# Patient Record
Sex: Female | Born: 1995 | Race: White | Hispanic: No | Marital: Single | Smoking: Never smoker
Health system: Southern US, Community
[De-identification: ages and names within clinical notes are randomized; demographics above are authoritative.]

## PROBLEM LIST (undated history)

## (undated) DIAGNOSIS — I10 Essential (primary) hypertension: Secondary | ICD-10-CM

---
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
## 2018-06-27 ENCOUNTER — Other Ambulatory Visit: Payer: Self-pay

## 2018-06-27 ENCOUNTER — Emergency Department (HOSPITAL_COMMUNITY)
Admission: EM | Admit: 2018-06-27 | Discharge: 2018-06-27 | Disposition: A | Discharge status: Home / Self Care | Payer: No Typology Code available for payment source | Attending: Emergency Medicine | Admitting: Emergency Medicine

## 2018-06-27 ENCOUNTER — Encounter (HOSPITAL_COMMUNITY): Payer: Self-pay | Admitting: Emergency Medicine

## 2018-06-27 DIAGNOSIS — I10 Essential (primary) hypertension: Secondary | ICD-10-CM | POA: Insufficient documentation

## 2018-06-27 DIAGNOSIS — Y93G1 Activity, food preparation and clean up: Secondary | ICD-10-CM | POA: Diagnosis not present

## 2018-06-27 DIAGNOSIS — Y990 Civilian activity done for income or pay: Secondary | ICD-10-CM | POA: Diagnosis not present

## 2018-06-27 DIAGNOSIS — Y929 Unspecified place or not applicable: Secondary | ICD-10-CM | POA: Diagnosis not present

## 2018-06-27 DIAGNOSIS — S61215A Laceration without foreign body of left ring finger without damage to nail, initial encounter: Secondary | ICD-10-CM | POA: Diagnosis present

## 2018-06-27 DIAGNOSIS — R000 Tachycardia, unspecified: Secondary | ICD-10-CM | POA: Insufficient documentation

## 2018-06-27 DIAGNOSIS — W458XXA Other foreign body or object entering through skin, initial encounter: Secondary | ICD-10-CM | POA: Insufficient documentation

## 2018-06-27 DIAGNOSIS — Z23 Encounter for immunization: Secondary | ICD-10-CM | POA: Diagnosis not present

## 2018-06-27 MED ORDER — TETANUS-DIPHTH-ACELL PERTUSSIS 5-2.5-18.5 LF-MCG/0.5 IM SUSP
0.5000 mL | Freq: Once | INTRAMUSCULAR | Status: AC
  Administered 2018-06-27: 0.5 mL via INTRAMUSCULAR
  Filled 2018-06-27: qty 0.5

## 2018-06-27 NOTE — Discharge Instructions (Addendum)
Return to ED if you start to develop chest pain, shortness of breath, coughing up blood or signs of infection of your finger including redness surrounding the site or pus draining from it.

## 2018-06-27 NOTE — ED Triage Notes (Signed)
Pt works at [employer] and cut her L ring finger on metal door. Laceration superficial and hemorrhage controled upon presentation here. Needs f/u for worker's comp.
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
## 2019-12-28 ENCOUNTER — Ambulatory Visit: Payer: Self-pay | Attending: Internal Medicine

## 2019-12-28 DIAGNOSIS — Z23 Encounter for immunization: Secondary | ICD-10-CM

## 2019-12-28 NOTE — Progress Notes (Signed)
   Covid-19 Vaccination Clinic  Name:  Sarah Banks    MRN: [id number] DOB: 01-Oct-1996  12/28/2019  Ms. [name] was observed post Covid-19 immunization for 15 minutes without incident. She was provided with Vaccine Information Sheet and instruction to access the V-Safe system.   Ms. [name] was instructed to call 911 with any severe reactions post vaccine: Marland Kitchen Difficulty breathing  . Swelling of face and throat  . A fast heartbeat  . A bad rash all over body  . Dizziness and weakness   Immunizations Administered    Name Date Dose VIS Date Route   Pfizer COVID-19 Vaccine 12/28/2019  1:28 PM 0.3 mL 09/29/2019 Intramuscular   Manufacturer: [employer], [employer]   Lot: [id number]   NDC: [address]-7

## 2020-01-22 ENCOUNTER — Ambulatory Visit: Payer: Medicaid Other | Attending: Internal Medicine

## 2020-01-22 DIAGNOSIS — Z23 Encounter for immunization: Secondary | ICD-10-CM

## 2020-01-22 NOTE — Progress Notes (Signed)
   Covid-19 Vaccination Clinic  Name:  Sarah Banks    MRN: [id number] DOB: 1996/09/02  01/22/2020  Ms. [name] was observed post Covid-19 immunization for 15 minutes without incident. She was provided with Vaccine Information Sheet and instruction to access the V-Safe system.   Ms. [name] was instructed to call 911 with any severe reactions post vaccine: Marland Kitchen Difficulty breathing  . Swelling of face and throat  . A fast heartbeat  . A bad rash all over body  . Dizziness and weakness   Immunizations Administered    Name Date Dose VIS Date Route   Pfizer COVID-19 Vaccine 01/22/2020 10:28 AM 0.3 mL 09/29/2019 Intramuscular   Manufacturer: [employer], [employer]   Lot: [id number]   NDC: [address]-0

## 2020-10-19 NOTE — L&D Delivery Note (Addendum)
OB/GYN Faculty Practice Delivery Note  Sarah Banks is a 25 y.o. G1P1 s/p vaginal delivery at [redacted]w[redacted]d. She was admitted for IUFD in the setting of unknown and unwanted pregnancy, now with HELLP, with IUD placement in 08/2020 at [facility].  ROM: 0h 2m with clear fluid GBS Status: n/a Maximum Maternal Temperature: 99.87F  Labor Progress: Pt initially received vaginal cytotec (600mg  for first dose, 400mg  for second dose). Given 5 whole cytotec tablets obtained from vaginal vault at 2305, a buccal dose of cytotec was administered. Pt then had SROM for clear fluid at 0256, at which time she was found to have complete cervical dilation. She then had a precipitous, uncomplicated delivery as noted below.  Delivery Date/Time: 02/17/21 at 0320 Delivery: Fetus delivered spontaneously from footling breech presentation without any maternal effort. Per pt request, fetus was placed in basin. Cord clamped x 2, and cut by author. Placenta delivered spontaneously with gentle cord traction. Fundus firm with massage and Pitocin. Labia, perineum, vagina, and cervix were inspected, without evidence of lacerations. Upon delivery of placenta, Paraguard IUD was visualized, adhered to amniotic membranes.   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 Also started procardia 30 mg daily. Reassuringly, blood pressure now in mild range and no concerning symptoms. Will continue to trend preeclampsia labs.  , MD OB/GYN Fellow, Faculty Practice   Attestation of Attending Supervision of Fellow: Evaluation and management procedures were performed by the fellow under my supervision.  I have seen and reviewed the fellow's note and chart, and I agree with the management and plan.   04/19/21  MD Attending Center for Sarah Banks)

## 2021-02-16 ENCOUNTER — Other Ambulatory Visit: Payer: Self-pay

## 2021-02-16 ENCOUNTER — Inpatient Hospital Stay (HOSPITAL_COMMUNITY)
Admission: EM | Admit: 2021-02-16 | Discharge: 2021-02-21 | DRG: 779 | Disposition: A | Discharge status: Home / Self Care | Payer: BC Managed Care – PPO | Attending: Obstetrics and Gynecology | Admitting: Obstetrics and Gynecology

## 2021-02-16 ENCOUNTER — Encounter (HOSPITAL_COMMUNITY): Payer: Self-pay

## 2021-02-16 ENCOUNTER — Inpatient Hospital Stay (HOSPITAL_BASED_OUTPATIENT_CLINIC_OR_DEPARTMENT_OTHER): Payer: BC Managed Care – PPO

## 2021-02-16 DIAGNOSIS — O328XX0 Maternal care for other malpresentation of fetus, not applicable or unspecified: Secondary | ICD-10-CM | POA: Diagnosis present

## 2021-02-16 DIAGNOSIS — O1422 HELLP syndrome (HELLP), second trimester: Secondary | ICD-10-CM | POA: Diagnosis present

## 2021-02-16 DIAGNOSIS — O99891 Other specified diseases and conditions complicating pregnancy: Secondary | ICD-10-CM

## 2021-02-16 DIAGNOSIS — R109 Unspecified abdominal pain: Secondary | ICD-10-CM | POA: Diagnosis present

## 2021-02-16 DIAGNOSIS — Z3A19 19 weeks gestation of pregnancy: Secondary | ICD-10-CM

## 2021-02-16 DIAGNOSIS — Z30432 Encounter for removal of intrauterine contraceptive device: Secondary | ICD-10-CM

## 2021-02-16 DIAGNOSIS — O364XX0 Maternal care for intrauterine death, not applicable or unspecified: Secondary | ICD-10-CM | POA: Diagnosis not present

## 2021-02-16 DIAGNOSIS — O26899 Other specified pregnancy related conditions, unspecified trimester: Secondary | ICD-10-CM

## 2021-02-16 DIAGNOSIS — O021 Missed abortion: Secondary | ICD-10-CM | POA: Diagnosis present

## 2021-02-16 DIAGNOSIS — O3680X0 Pregnancy with inconclusive fetal viability, not applicable or unspecified: Secondary | ICD-10-CM

## 2021-02-16 DIAGNOSIS — O4692 Antepartum hemorrhage, unspecified, second trimester: Secondary | ICD-10-CM

## 2021-02-16 DIAGNOSIS — Z20822 Contact with and (suspected) exposure to covid-19: Secondary | ICD-10-CM | POA: Diagnosis present

## 2021-02-16 DIAGNOSIS — O1425 HELLP syndrome, complicating the puerperium: Secondary | ICD-10-CM | POA: Diagnosis not present

## 2021-02-16 DIAGNOSIS — O1424 HELLP syndrome, complicating childbirth: Secondary | ICD-10-CM | POA: Diagnosis not present

## 2021-02-16 DIAGNOSIS — Z3490 Encounter for supervision of normal pregnancy, unspecified, unspecified trimester: Secondary | ICD-10-CM

## 2021-02-16 DIAGNOSIS — O162 Unspecified maternal hypertension, second trimester: Secondary | ICD-10-CM

## 2021-02-16 DIAGNOSIS — O368320 Maternal care for abnormalities of the fetal heart rate or rhythm, second trimester, not applicable or unspecified: Secondary | ICD-10-CM

## 2021-02-16 DIAGNOSIS — O1420 HELLP syndrome (HELLP), unspecified trimester: Secondary | ICD-10-CM

## 2021-02-16 LAB — CBC
HCT: 31.4 % — ABNORMAL LOW (ref 36.0–46.0)
HCT: 34.5 % — ABNORMAL LOW (ref 36.0–46.0)
Hemoglobin: 10.9 g/dL — ABNORMAL LOW (ref 12.0–15.0)
Hemoglobin: 12.1 g/dL (ref 12.0–15.0)
MCH: 29.5 pg (ref 26.0–34.0)
MCH: 29.7 pg (ref 26.0–34.0)
MCHC: 34.7 g/dL (ref 30.0–36.0)
MCHC: 35.1 g/dL (ref 30.0–36.0)
MCV: 84.9 fL (ref 80.0–100.0)
MCV: 84.6 fL (ref 80.0–100.0)
Platelets: 100 10*3/uL — ABNORMAL LOW (ref 150–400)
Platelets: 120 10*3/uL — ABNORMAL LOW (ref 150–400)
RBC: 3.7 MIL/uL — ABNORMAL LOW (ref 3.87–5.11)
RBC: 4.08 MIL/uL (ref 3.87–5.11)
RDW: 15.2 % (ref 11.5–15.5)
RDW: 15.2 % (ref 11.5–15.5)
WBC: 14.7 10*3/uL — ABNORMAL HIGH (ref 4.0–10.5)
WBC: 15.8 10*3/uL — ABNORMAL HIGH (ref 4.0–10.5)
nRBC: 0 % (ref 0.0–0.2)
nRBC: 0 % (ref 0.0–0.2)

## 2021-02-16 LAB — TYPE AND SCREEN
ABO/RH(D): O POS
Antibody Screen: NEGATIVE

## 2021-02-16 LAB — COMPREHENSIVE METABOLIC PANEL
ALT: 79 U/L — ABNORMAL HIGH (ref 0–44)
ALT: 68 U/L — ABNORMAL HIGH (ref 0–44)
ALT: 74 U/L — ABNORMAL HIGH (ref 0–44)
AST: 73 U/L — ABNORMAL HIGH (ref 15–41)
AST: 60 U/L — ABNORMAL HIGH (ref 15–41)
AST: 57 U/L — ABNORMAL HIGH (ref 15–41)
Albumin: 2.2 g/dL — ABNORMAL LOW (ref 3.5–5.0)
Albumin: 1.9 g/dL — ABNORMAL LOW (ref 3.5–5.0)
Albumin: 2 g/dL — ABNORMAL LOW (ref 3.5–5.0)
Alkaline Phosphatase: 127 U/L — ABNORMAL HIGH (ref 38–126)
Alkaline Phosphatase: 106 U/L (ref 38–126)
Alkaline Phosphatase: 123 U/L (ref 38–126)
Anion gap: 9 (ref 5–15)
Anion gap: 6 (ref 5–15)
Anion gap: 8 (ref 5–15)
BUN: 6 mg/dL (ref 6–20)
BUN: 8 mg/dL (ref 6–20)
BUN: 8 mg/dL (ref 6–20)
CO2: 25 mmol/L (ref 22–32)
CO2: 24 mmol/L (ref 22–32)
CO2: 23 mmol/L (ref 22–32)
Calcium: 8.2 mg/dL — ABNORMAL LOW (ref 8.9–10.3)
Calcium: 7.8 mg/dL — ABNORMAL LOW (ref 8.9–10.3)
Calcium: 7.9 mg/dL — ABNORMAL LOW (ref 8.9–10.3)
Chloride: 102 mmol/L (ref 98–111)
Chloride: 104 mmol/L (ref 98–111)
Chloride: 101 mmol/L (ref 98–111)
Creatinine, Ser: 0.88 mg/dL (ref 0.44–1.00)
Creatinine, Ser: 0.96 mg/dL (ref 0.44–1.00)
Creatinine, Ser: 0.87 mg/dL (ref 0.44–1.00)
GFR, Estimated: >60 mL/min (ref >60)
GFR, Estimated: >60 mL/min (ref >60)
GFR, Estimated: >60 mL/min (ref >60)
Glucose, Bld: 84 mg/dL (ref 70–99)
Glucose, Bld: 102 mg/dL — ABNORMAL HIGH (ref 70–99)
Glucose, Bld: 99 mg/dL (ref 70–99)
Potassium: 3.3 mmol/L — ABNORMAL LOW (ref 3.5–5.1)
Potassium: 3.5 mmol/L (ref 3.5–5.1)
Potassium: 3.9 mmol/L (ref 3.5–5.1)
Sodium: 136 mmol/L (ref 135–145)
Sodium: 134 mmol/L — ABNORMAL LOW (ref 135–145)
Sodium: 132 mmol/L — ABNORMAL LOW (ref 135–145)
Total Bilirubin: 0.6 mg/dL (ref 0.3–1.2)
Total Bilirubin: <0.1 mg/dL — ABNORMAL LOW (ref 0.3–1.2)
Total Bilirubin: 0.4 mg/dL (ref 0.3–1.2)
Total Protein: 5.2 g/dL — ABNORMAL LOW (ref 6.5–8.1)
Total Protein: 4.7 g/dL — ABNORMAL LOW (ref 6.5–8.1)
Total Protein: 5.1 g/dL — ABNORMAL LOW (ref 6.5–8.1)

## 2021-02-16 LAB — URINALYSIS, ROUTINE W REFLEX MICROSCOPIC
Bilirubin Urine: NEGATIVE
Glucose, UA: NEGATIVE mg/dL
Ketones, ur: NEGATIVE mg/dL
Nitrite: NEGATIVE
Protein, ur: >=300 mg/dL — AB
Specific Gravity, Urine: 1.011 (ref 1.005–1.030)
pH: 7 (ref 5.0–8.0)

## 2021-02-16 LAB — CBC WITH DIFFERENTIAL/PLATELET
Abs Immature Granulocytes: 0.06 10*3/uL (ref 0.00–0.07)
Basophils Absolute: 0.1 10*3/uL (ref 0.0–0.1)
Basophils Relative: 0 %
Eosinophils Absolute: 0.1 10*3/uL (ref 0.0–0.5)
Eosinophils Relative: 1 %
HCT: 37.7 % (ref 36.0–46.0)
Hemoglobin: 12.9 g/dL (ref 12.0–15.0)
Immature Granulocytes: 0 %
Lymphocytes Relative: 15 %
Lymphs Abs: 2 10*3/uL (ref 0.7–4.0)
MCH: 29.5 pg (ref 26.0–34.0)
MCHC: 34.2 g/dL (ref 30.0–36.0)
MCV: 86.1 fL (ref 80.0–100.0)
Monocytes Absolute: 0.8 10*3/uL (ref 0.1–1.0)
Monocytes Relative: 6 %
Neutro Abs: 10.4 10*3/uL — ABNORMAL HIGH (ref 1.7–7.7)
Neutrophils Relative %: 78 %
Platelets: 124 10*3/uL — ABNORMAL LOW (ref 150–400)
RBC: 4.38 MIL/uL (ref 3.87–5.11)
RDW: 15.3 % (ref 11.5–15.5)
WBC: 13.4 10*3/uL — ABNORMAL HIGH (ref 4.0–10.5)
nRBC: 0 % (ref 0.0–0.2)

## 2021-02-16 LAB — PROTEIN / CREATININE RATIO, URINE
Creatinine, Urine: 86.79 mg/dL
Protein Creatinine Ratio: 11.89 mg/mg{Cre} — ABNORMAL HIGH (ref 0.00–0.15)
Total Protein, Urine: 1032 mg/dL

## 2021-02-16 LAB — WET PREP, GENITAL
Clue Cells Wet Prep HPF POC: NONE SEEN
Sperm: NONE SEEN
Trich, Wet Prep: NONE SEEN
Yeast Wet Prep HPF POC: NONE SEEN

## 2021-02-16 LAB — RESP PANEL BY RT-PCR (FLU A&B, COVID) ARPGX2
Influenza A by PCR: NEGATIVE
Influenza B by PCR: NEGATIVE
SARS Coronavirus 2 by RT PCR: NEGATIVE

## 2021-02-16 LAB — DIC (DISSEMINATED INTRAVASCULAR COAGULATION)PANEL
D-Dimer, Quant: 4.25 ug/mL-FEU — ABNORMAL HIGH (ref 0.00–0.50)
Fibrinogen: >800 mg/dL — ABNORMAL HIGH (ref 210–475)
INR: 0.9 (ref 0.8–1.2)
Platelets: 106 10*3/uL — ABNORMAL LOW (ref 150–400)
Prothrombin Time: 12.6 seconds (ref 11.4–15.2)
Smear Review: NONE SEEN
aPTT: 29 seconds (ref 24–36)

## 2021-02-16 LAB — HCG, QUANTITATIVE, PREGNANCY: hCG, Beta Chain, Quant, S: 68450 m[IU]/mL — ABNORMAL HIGH (ref <5)

## 2021-02-16 LAB — HIV ANTIBODY (ROUTINE TESTING W REFLEX): HIV Screen 4th Generation wRfx: NONREACTIVE

## 2021-02-16 LAB — POC URINE PREG, ED: Preg Test, Ur: POSITIVE — AB

## 2021-02-16 LAB — ABO/RH: ABO/RH(D): O POS

## 2021-02-16 MED ORDER — HYDRALAZINE HCL 20 MG/ML IJ SOLN
10.0000 mg | INTRAMUSCULAR | Status: DC | PRN
Start: 2021-02-16 — End: 2021-02-19

## 2021-02-16 MED ORDER — LABETALOL HCL 5 MG/ML IV SOLN
80.0000 mg | INTRAVENOUS | Status: DC | PRN
  Administered 2021-02-16: 80 mg via INTRAVENOUS
  Filled 2021-02-16: qty 16

## 2021-02-16 MED ORDER — SODIUM CHLORIDE 0.9 % IV BOLUS
500.0000 mL | Freq: Once | INTRAVENOUS | Status: AC
  Administered 2021-02-16: 500 mL via INTRAVENOUS

## 2021-02-16 MED ORDER — MISOPROSTOL 200 MCG PO TABS
600.0000 ug | ORAL_TABLET | Freq: Once | ORAL | Status: AC
  Administered 2021-02-16: 600 ug via VAGINAL
  Filled 2021-02-16: qty 3

## 2021-02-16 MED ORDER — MAGNESIUM SULFATE 40 GM/1000ML IV SOLN
2.0000 g/h | INTRAVENOUS | Status: DC
  Filled 2021-02-16: qty 1000

## 2021-02-16 MED ORDER — MISOPROSTOL 200 MCG PO TABS
ORAL_TABLET | ORAL | Status: AC
  Filled 2021-02-16: qty 1

## 2021-02-16 MED ORDER — MISOPROSTOL 200 MCG PO TABS
400.0000 ug | ORAL_TABLET | ORAL | Status: DC
  Administered 2021-02-16: 400 ug via VAGINAL
  Filled 2021-02-16 (×3): qty 2

## 2021-02-16 MED ORDER — LACTATED RINGERS IV SOLN: INTRAVENOUS | Status: DC

## 2021-02-16 MED ORDER — LABETALOL HCL 100 MG PO TABS
200.0000 mg | ORAL_TABLET | Freq: Once | ORAL | Status: AC
  Administered 2021-02-16: 200 mg via ORAL
  Filled 2021-02-16: qty 2

## 2021-02-16 MED ORDER — NIFEDIPINE ER OSMOTIC RELEASE 30 MG PO TB24
30.0000 mg | ORAL_TABLET | Freq: Every day | ORAL | Status: DC
  Administered 2021-02-16 – 2021-02-17 (×2): 30 mg via ORAL
  Filled 2021-02-16 (×2): qty 1

## 2021-02-16 MED ORDER — MAGNESIUM SULFATE BOLUS VIA INFUSION
4.0000 g | Freq: Once | INTRAVENOUS | Status: AC
  Administered 2021-02-16: 4 g via INTRAVENOUS
  Filled 2021-02-16: qty 1000

## 2021-02-16 MED ORDER — LABETALOL HCL 5 MG/ML IV SOLN
20.0000 mg | INTRAVENOUS | Status: DC | PRN
  Administered 2021-02-16 – 2021-02-19 (×5): 20 mg via INTRAVENOUS
  Filled 2021-02-16 (×5): qty 4

## 2021-02-16 MED ORDER — MISOPROSTOL 200 MCG PO TABS: 400.0000 ug | ORAL_TABLET | Freq: Once | ORAL | Status: DC

## 2021-02-16 MED ORDER — MISOPROSTOL 200 MCG PO TABS
400.0000 ug | ORAL_TABLET | Freq: Once | ORAL | Status: AC
  Administered 2021-02-16: 400 ug via BUCCAL

## 2021-02-16 MED ORDER — LABETALOL HCL 5 MG/ML IV SOLN
40.0000 mg | INTRAVENOUS | Status: DC | PRN
  Administered 2021-02-16: 40 mg via INTRAVENOUS
  Filled 2021-02-16 (×2): qty 8

## 2021-02-16 MED ORDER — MISOPROSTOL 200 MCG PO TABS: 400.0000 ug | ORAL_TABLET | ORAL | Status: DC

## 2021-02-16 MED ORDER — SODIUM CHLORIDE 0.9 % IV BOLUS: 1000.0000 mL | Freq: Once | INTRAVENOUS | Status: DC

## 2021-02-16 NOTE — Progress Notes (Signed)
Sarah Banks is a 25 y.o. G1P0 at [redacted]w[redacted]d.  Subjective: No complaints.   Objective: BP 135/85   Pulse 84   Temp 98.6 F (37 C) (Oral)   Resp 16   Ht 5\' 4"  (1.626 m)   Wt 97.5 kg   LMP 09/07/2020 (Within Days)   SpO2 99%   BMI 36.90 kg/m    FHT:  NA UC: None    Labs: Results for orders placed or performed during the hospital encounter of 02/16/21 (from the past 24 hour(s))  POC urine preg, ED     Status: Abnormal   Collection Time: 02/16/21  8:36 AM  Result Value Ref Range   Preg Test, Ur POSITIVE (A) NEGATIVE  Urinalysis, Routine w reflex microscopic Urine, Clean Catch     Status: Abnormal   Collection Time: 02/16/21  8:44 AM  Result Value Ref Range   Color, Urine YELLOW YELLOW   APPearance HAZY (A) CLEAR   Specific Gravity, Urine 1.011 1.005 - 1.030   pH 7.0 5.0 - 8.0   Glucose, UA NEGATIVE NEGATIVE mg/dL   Hgb urine dipstick MODERATE (A) NEGATIVE   Bilirubin Urine NEGATIVE NEGATIVE   Ketones, ur NEGATIVE NEGATIVE mg/dL   Protein, ur 04/18/21 (A) NEGATIVE mg/dL   Nitrite NEGATIVE NEGATIVE   Leukocytes,Ua TRACE (A) NEGATIVE   RBC / HPF 0-5 0 - 5 RBC/hpf   WBC, UA 21-50 0 - 5 WBC/hpf   Bacteria, UA RARE (A) NONE SEEN   Squamous Epithelial / LPF 6-10 0 - 5  hCG, quantitative, pregnancy     Status: Abnormal   Collection Time: 02/16/21  8:54 AM  Result Value Ref Range   hCG, Beta Chain, Quant, S 68,450 (H) <5 mIU/mL  Comprehensive metabolic panel     Status: Abnormal   Collection Time: 02/16/21  8:57 AM  Result Value Ref Range   Sodium 136 135 - 145 mmol/L   Potassium 3.3 (L) 3.5 - 5.1 mmol/L   Chloride 102 98 - 111 mmol/L   CO2 25 22 - 32 mmol/L   Glucose, Bld 84 70 - 99 mg/dL   BUN 6 6 - 20 mg/dL   Creatinine, Ser 04/18/21 0.44 - 1.00 mg/dL   Calcium 8.2 (L) 8.9 - 10.3 mg/dL   Total Protein 5.2 (L) 6.5 - 8.1 g/dL   Albumin 2.2 (L) 3.5 - 5.0 g/dL   AST 73 (H) 15 - 41 U/L   ALT 79 (H) 0 - 44 U/L   Alkaline Phosphatase 127 (H) 38 - 126 U/L   Total Bilirubin 0.6 0.3  - 1.2 mg/dL   GFR, Estimated 7.61 >95 mL/min   Anion gap 9 5 - 15  CBC with Differential     Status: Abnormal   Collection Time: 02/16/21  8:57 AM  Result Value Ref Range   WBC 13.4 (H) 4.0 - 10.5 K/uL   RBC 4.38 3.87 - 5.11 MIL/uL   Hemoglobin 12.9 12.0 - 15.0 g/dL   HCT 04/18/21 32.6 - 71.2 %   MCV 86.1 80.0 - 100.0 fL   MCH 29.5 26.0 - 34.0 pg   MCHC 34.2 30.0 - 36.0 g/dL   RDW 45.8 09.9 - 83.3 %   Platelets 124 (L) 150 - 400 K/uL   nRBC 0.0 0.0 - 0.2 %   Neutrophils Relative % 78 %   Neutro Abs 10.4 (H) 1.7 - 7.7 K/uL   Lymphocytes Relative 15 %   Lymphs Abs 2.0 0.7 - 4.0 K/uL   Monocytes Relative  6 %   Monocytes Absolute 0.8 0.1 - 1.0 K/uL   Eosinophils Relative 1 %   Eosinophils Absolute 0.1 0.0 - 0.5 K/uL   Basophils Relative 0 %   Basophils Absolute 0.1 0.0 - 0.1 K/uL   Immature Granulocytes 0 %   Abs Immature Granulocytes 0.06 0.00 - 0.07 K/uL  Protein / creatinine ratio, urine     Status: Abnormal   Collection Time: 02/16/21  9:07 AM  Result Value Ref Range   Creatinine, Urine 86.79 mg/dL   Total Protein, Urine 1,032 mg/dL   Protein Creatinine Ratio 11.89 (H) 0.00 - 0.15 mg/mg[Cre]  ABO/Rh     Status: None   Collection Time: 02/16/21  9:36 AM  Result Value Ref Range   ABO/RH(D)      O POS Performed at [facility], [address], [place], [place] [address]   DIC Panel ONCE - STAT     Status: Abnormal   Collection Time: 02/16/21 11:29 AM  Result Value Ref Range   Prothrombin Time 12.6 11.4 - 15.2 seconds   INR 0.9 0.8 - 1.2   aPTT 29 24 - 36 seconds   Fibrinogen >800 (H) 210 - 475 mg/dL   D-Dimer, Quant 0.17 (H) 0.00 - 0.50 ug/mL-FEU   Platelets 106 (L) 150 - 400 K/uL   Smear Review NO SCHISTOCYTES SEEN   Resp Panel by RT-PCR (Flu A&B, Covid) Nasopharyngeal Swab     Status: None   Collection Time: 02/16/21 11:36 AM   Specimen: Nasopharyngeal Swab; Nasopharyngeal(NP) swabs in vial transport medium  Result Value Ref Range   SARS Coronavirus 2 by RT PCR  NEGATIVE NEGATIVE   Influenza A by PCR NEGATIVE NEGATIVE   Influenza B by PCR NEGATIVE NEGATIVE  CBC     Status: Abnormal   Collection Time: 02/16/21  1:20 PM  Result Value Ref Range   WBC 14.7 (H) 4.0 - 10.5 K/uL   RBC 3.70 (L) 3.87 - 5.11 MIL/uL   Hemoglobin 10.9 (L) 12.0 - 15.0 g/dL   HCT 49.4 (L) 49.6 - 75.9 %   MCV 84.9 80.0 - 100.0 fL   MCH 29.5 26.0 - 34.0 pg   MCHC 34.7 30.0 - 36.0 g/dL   RDW 16.3 84.6 - 65.9 %   Platelets 100 (L) 150 - 400 K/uL   nRBC 0.0 0.0 - 0.2 %  Comprehensive metabolic panel     Status: Abnormal   Collection Time: 02/16/21  1:20 PM  Result Value Ref Range   Sodium 134 (L) 135 - 145 mmol/L   Potassium 3.5 3.5 - 5.1 mmol/L   Chloride 104 98 - 111 mmol/L   CO2 24 22 - 32 mmol/L   Glucose, Bld 102 (H) 70 - 99 mg/dL   BUN 8 6 - 20 mg/dL   Creatinine, Ser 9.35 0.44 - 1.00 mg/dL   Calcium 7.8 (L) 8.9 - 10.3 mg/dL   Total Protein 4.7 (L) 6.5 - 8.1 g/dL   Albumin 1.9 (L) 3.5 - 5.0 g/dL   AST 60 (H) 15 - 41 U/L   ALT 68 (H) 0 - 44 U/L   Alkaline Phosphatase 106 38 - 126 U/L   Total Bilirubin <0.1 (L) 0.3 - 1.2 mg/dL   GFR, Estimated >70 >17 mL/min   Anion gap 6 5 - 15  Type and screen     Status: None (Preliminary result)   Collection Time: 02/16/21  1:20 PM  Result Value Ref Range   ABO/RH(D) PENDING  Antibody Screen PENDING    Sample Expiration      02/19/2021,2359 Performed at [facility], [address], [place], [place] [address]   HIV Antibody (routine testing w rflx)     Status: None   Collection Time: 02/16/21  1:20 PM  Result Value Ref Range   HIV Screen 4th Generation wRfx Non Reactive Non Reactive    Assessment / Plan: [redacted]w[redacted]d week IUP Labor: IOL for IUFD. Cytotec 600 mcg Per vagina.  Fetal Wellbeing:  Category NA Pain Control:  IV meds PRN Anticipated MOD:  SVD  Dorathy Kinsman, CNM 02/16/2021 3:30 PM

## 2021-02-16 NOTE — Progress Notes (Addendum)
Sarah Banks is a 25 y.o. G1P0 at [redacted]w[redacted]d here for IOL for IUFD.  Subjective: Having more cramping, not severe. Otherwise no complaints. No HA or blurry vision. Denies history of chronic hypertension.   Objective: BP (!) 151/106   Pulse 79   Temp 98.4 F (36.9 C) (Oral)   Resp 17   Ht 5\' 4"  (1.626 m)   Wt 97.5 kg   LMP 09/07/2020 (Within Days)   SpO2 99%   BMI 36.90 kg/m    FHT:  NA Toco: not monitoring currently Dilation: Closed Exam by:: Dr. [email or web address]  Labs: Results for orders placed or performed during the hospital encounter of 02/16/21 (from the past 24 hour(s))  POC urine preg, ED     Status: Abnormal   Collection Time: 02/16/21  8:36 AM  Result Value Ref Range   Preg Test, Ur POSITIVE (A) NEGATIVE  Urinalysis, Routine w reflex microscopic Urine, Clean Catch     Status: Abnormal   Collection Time: 02/16/21  8:44 AM  Result Value Ref Range   Color, Urine YELLOW YELLOW   APPearance HAZY (A) CLEAR   Specific Gravity, Urine 1.011 1.005 - 1.030   pH 7.0 5.0 - 8.0   Glucose, UA NEGATIVE NEGATIVE mg/dL   Hgb urine dipstick MODERATE (A) NEGATIVE   Bilirubin Urine NEGATIVE NEGATIVE   Ketones, ur NEGATIVE NEGATIVE mg/dL   Protein, ur 04/18/21 (A) NEGATIVE mg/dL   Nitrite NEGATIVE NEGATIVE   Leukocytes,Ua TRACE (A) NEGATIVE   RBC / HPF 0-5 0 - 5 RBC/hpf   WBC, UA 21-50 0 - 5 WBC/hpf   Bacteria, UA RARE (A) NONE SEEN   Squamous Epithelial / LPF 6-10 0 - 5  hCG, quantitative, pregnancy     Status: Abnormal   Collection Time: 02/16/21  8:54 AM  Result Value Ref Range   hCG, Beta Chain, Quant, S 68,450 (H) <5 mIU/mL  Comprehensive metabolic panel     Status: Abnormal   Collection Time: 02/16/21  8:57 AM  Result Value Ref Range   Sodium 136 135 - 145 mmol/L   Potassium 3.3 (L) 3.5 - 5.1 mmol/L   Chloride 102 98 - 111 mmol/L   CO2 25 22 - 32 mmol/L   Glucose, Bld 84 70 - 99 mg/dL   BUN 6 6 - 20 mg/dL   Creatinine, Ser 04/18/21 0.44 - 1.00 mg/dL   Calcium 8.2 (L) 8.9 - 10.3 mg/dL    Total Protein 5.2 (L) 6.5 - 8.1 g/dL   Albumin 2.2 (L) 3.5 - 5.0 g/dL   AST 73 (H) 15 - 41 U/L   ALT 79 (H) 0 - 44 U/L   Alkaline Phosphatase 127 (H) 38 - 126 U/L   Total Bilirubin 0.6 0.3 - 1.2 mg/dL   GFR, Estimated 5.46 >50 mL/min   Anion gap 9 5 - 15  CBC with Differential     Status: Abnormal   Collection Time: 02/16/21  8:57 AM  Result Value Ref Range   WBC 13.4 (H) 4.0 - 10.5 K/uL   RBC 4.38 3.87 - 5.11 MIL/uL   Hemoglobin 12.9 12.0 - 15.0 g/dL   HCT 04/18/21 46.5 - 68.1 %   MCV 86.1 80.0 - 100.0 fL   MCH 29.5 26.0 - 34.0 pg   MCHC 34.2 30.0 - 36.0 g/dL   RDW 27.5 17.0 - 01.7 %   Platelets 124 (L) 150 - 400 K/uL   nRBC 0.0 0.0 - 0.2 %   Neutrophils Relative % 78 %  Neutro Abs 10.4 (H) 1.7 - 7.7 K/uL   Lymphocytes Relative 15 %   Lymphs Abs 2.0 0.7 - 4.0 K/uL   Monocytes Relative 6 %   Monocytes Absolute 0.8 0.1 - 1.0 K/uL   Eosinophils Relative 1 %   Eosinophils Absolute 0.1 0.0 - 0.5 K/uL   Basophils Relative 0 %   Basophils Absolute 0.1 0.0 - 0.1 K/uL   Immature Granulocytes 0 %   Abs Immature Granulocytes 0.06 0.00 - 0.07 K/uL  Protein / creatinine ratio, urine     Status: Abnormal   Collection Time: 02/16/21  9:07 AM  Result Value Ref Range   Creatinine, Urine 86.79 mg/dL   Total Protein, Urine 1,032 mg/dL   Protein Creatinine Ratio 11.89 (H) 0.00 - 0.15 mg/mg[Cre]  ABO/Rh     Status: None   Collection Time: 02/16/21  9:36 AM  Result Value Ref Range   ABO/RH(D)      O POS Performed at [facility], [address] [place], [place] [address]   DIC Panel ONCE - STAT     Status: Abnormal   Collection Time: 02/16/21 11:29 AM  Result Value Ref Range   Prothrombin Time 12.6 11.4 - 15.2 seconds   INR 0.9 0.8 - 1.2   aPTT 29 24 - 36 seconds   Fibrinogen >800 (H) 210 - 475 mg/dL   D-Dimer, Quant 3.15 (H) 0.00 - 0.50 ug/mL-FEU   Platelets 106 (L) 150 - 400 K/uL   Smear Review NO SCHISTOCYTES SEEN   Resp Panel by RT-PCR (Flu A&B, Covid) Nasopharyngeal Swab      Status: None   Collection Time: 02/16/21 11:36 AM   Specimen: Nasopharyngeal Swab; Nasopharyngeal(NP) swabs in vial transport medium  Result Value Ref Range   SARS Coronavirus 2 by RT PCR NEGATIVE NEGATIVE   Influenza A by PCR NEGATIVE NEGATIVE   Influenza B by PCR NEGATIVE NEGATIVE  CBC     Status: Abnormal   Collection Time: 02/16/21  1:20 PM  Result Value Ref Range   WBC 14.7 (H) 4.0 - 10.5 K/uL   RBC 3.70 (L) 3.87 - 5.11 MIL/uL   Hemoglobin 10.9 (L) 12.0 - 15.0 g/dL   HCT 40.0 (L) 86.7 - 61.9 %   MCV 84.9 80.0 - 100.0 fL   MCH 29.5 26.0 - 34.0 pg   MCHC 34.7 30.0 - 36.0 g/dL   RDW 50.9 32.6 - 71.2 %   Platelets 100 (L) 150 - 400 K/uL   nRBC 0.0 0.0 - 0.2 %  Comprehensive metabolic panel     Status: Abnormal   Collection Time: 02/16/21  1:20 PM  Result Value Ref Range   Sodium 134 (L) 135 - 145 mmol/L   Potassium 3.5 3.5 - 5.1 mmol/L   Chloride 104 98 - 111 mmol/L   CO2 24 22 - 32 mmol/L   Glucose, Bld 102 (H) 70 - 99 mg/dL   BUN 8 6 - 20 mg/dL   Creatinine, Ser 4.58 0.44 - 1.00 mg/dL   Calcium 7.8 (L) 8.9 - 10.3 mg/dL   Total Protein 4.7 (L) 6.5 - 8.1 g/dL   Albumin 1.9 (L) 3.5 - 5.0 g/dL   AST 60 (H) 15 - 41 U/L   ALT 68 (H) 0 - 44 U/L   Alkaline Phosphatase 106 38 - 126 U/L   Total Bilirubin <0.1 (L) 0.3 - 1.2 mg/dL   GFR, Estimated >09 >98 mL/min   Anion gap 6 5 - 15  Type and  screen     Status: None   Collection Time: 02/16/21  1:20 PM  Result Value Ref Range   ABO/RH(D) O POS    Antibody Screen NEG    Sample Expiration      02/19/2021,2359 Performed at [facility], [address] [place], [place] [address]   HIV Antibody (routine testing w rflx)     Status: None   Collection Time: 02/16/21  1:20 PM  Result Value Ref Range   HIV Screen 4th Generation wRfx Non Reactive Non Reactive  Wet prep, genital     Status: Abnormal   Collection Time: 02/16/21  2:40 PM   Specimen: Vaginal  Result Value Ref Range   Yeast Wet Prep HPF POC NONE SEEN NONE  SEEN   Trich, Wet Prep NONE SEEN NONE SEEN   Clue Cells Wet Prep HPF POC NONE SEEN NONE SEEN   WBC, Wet Prep HPF POC MODERATE (A) NONE SEEN   Sperm NONE SEEN   CBC     Status: Abnormal   Collection Time: 02/16/21  6:16 PM  Result Value Ref Range   WBC 15.8 (H) 4.0 - 10.5 K/uL   RBC 4.08 3.87 - 5.11 MIL/uL   Hemoglobin 12.1 12.0 - 15.0 g/dL   HCT 60.434.5 (L) 54.036.0 - 98.146.0 %   MCV 84.6 80.0 - 100.0 fL   MCH 29.7 26.0 - 34.0 pg   MCHC 35.1 30.0 - 36.0 g/dL   RDW 19.115.2 47.811.5 - 29.515.5 %   Platelets 120 (L) 150 - 400 K/uL   nRBC 0.0 0.0 - 0.2 %  Comprehensive metabolic panel     Status: Abnormal   Collection Time: 02/16/21  6:16 PM  Result Value Ref Range   Sodium 132 (L) 135 - 145 mmol/L   Potassium 3.9 3.5 - 5.1 mmol/L   Chloride 101 98 - 111 mmol/L   CO2 23 22 - 32 mmol/L   Glucose, Bld 99 70 - 99 mg/dL   BUN 8 6 - 20 mg/dL   Creatinine, Ser 6.210.87 0.44 - 1.00 mg/dL   Calcium 7.9 (L) 8.9 - 10.3 mg/dL   Total Protein 5.1 (L) 6.5 - 8.1 g/dL   Albumin 2.0 (L) 3.5 - 5.0 g/dL   AST 57 (H) 15 - 41 U/L   ALT 74 (H) 0 - 44 U/L   Alkaline Phosphatase 123 38 - 126 U/L   Total Bilirubin 0.4 0.3 - 1.2 mg/dL   GFR, Estimated >30>60 >86>60 mL/min   Anion gap 8 5 - 15  CBC     Status: Abnormal   Collection Time: 02/17/21 12:15 AM  Result Value Ref Range   WBC 17.1 (H) 4.0 - 10.5 K/uL   RBC 4.10 3.87 - 5.11 MIL/uL   Hemoglobin 12.3 12.0 - 15.0 g/dL   HCT 57.835.1 (L) 46.936.0 - 62.946.0 %   MCV 85.6 80.0 - 100.0 fL   MCH 30.0 26.0 - 34.0 pg   MCHC 35.0 30.0 - 36.0 g/dL   RDW 52.815.2 41.311.5 - 24.415.5 %   Platelets 141 (L) 150 - 400 K/uL   nRBC 0.0 0.0 - 0.2 %    Assessment / Plan: Sarah Banks is a 25yoG1P0 who was admitted at 4371w2d for IUFD. Labor: IOL for IUFD.   - Cytotec 600 mcg per vagina x1 @1450 , cytotec 400 mcg x2 @ 1855 and 2300. On cervical exam, all prior cytotec tablets had not dissolved so they were removed from the vagina.  - Buccal cytotec 400  mg administered. Zofran for nausea as needed.  HELLP  Syndrome: Asymptomatic currently. Pressures remain mid-range most recently. Pr/Cr 11.89; of note, per nursing urine sample was contaminated with a moderate amount of "bloody mucous". Hgb wnl. Plts 141. LFTs slightly down-trending on last check (AST 57/ALT 74).  - Started Procardia 30 mg daily given severe range of blood pressures on admission. Last dose of IV labetalol at 2033. Pain Control:  IV meds PRN Anticipated MOD:  SVD  Sheila Oats, MD 02/17/2021 12:26 AM

## 2021-02-16 NOTE — ED Provider Notes (Signed)
Emergency Medicine Provider OB Triage Evaluation Note  Sarah Banks is a 25 y.o. female, No obstetric history on file., at Unknown gestation who presents to the emergency department with complaints of abdominal cramping and urinary sx. LMP in Nov 2021 when she had her IUD placed. Began having some lower abdominal cramping, urinary sx, intermittent nausea over the last week. Some bleeding as well, either hematuria or vaginal bleeding. Went to urgent care and urine was neg but preg was positive. This is her first pregnancy. No CP or SOB. Reports quite a bit of white coat hypertension which is usual for her.   Review of  Systems  Positive: abd cramping Negative: CP  Physical Exam  BP (!) 174/115   Pulse (!) 117   Temp 98.8 F (37.1 C) (Oral)   Resp (!) 37   SpO2 97%  General: Awake, no distress, well-appearing HEENT: Atraumatic  Resp: Normal effort  Cardiac: tachycardic Abd: Nondistended, mild TTP lower abdomen, nonfocal MSK: Moves all extremities without difficulty Neuro: Speech clear  Medical Decision Making  Pt evaluated for pregnancy concern. Urine preg is positive. Noted to have abnormal vital signs, though improving. She is overall well-appearing, likely has large component of anxiety though appears well. Pt is in agreement with plan for transfer.  9:22 AM Discussed with MAU APP, Sarah Banks. Discussed abnormal VS and presentation. [facility] accepts patient in transfer.  Clinical Impression  No diagnosis found.     Sarah Banks, Sarah N, PA-C 02/16/21 4235    Sarah Loll, MD 02/17/21 [phone]

## 2021-02-16 NOTE — H&P (Addendum)
History   CSN: [id number]  Arrival date and time: 02/16/21 8416   Event Date/Time   First Provider Initiated Contact with Patient 02/16/21 1013         Chief Complaint  Patient presents with  . Abdominal Pain   HPI Sarah Banks is a 25 y.o. G1P0 at Unknown gestation who presents from [facility] for evaluation of her pregnancy. She states [facility] and has not had a period since then. She reports she started having intermittent abdominal pain this week. She rates the pain a 4/10 and tylenol has not helped. She also reports intermittent nausea. She denies bleeding or leaking of fluid.   She reports white coat syndrome when she presented to the ER and denies hx of hypertension. She reports a "black sunspot" in her vision that started about a week ago. She denies HA or right upper quadrant pain. Labs were drawn in the ER before her arrival to MAU.          OB History    Gravida  1   Para      Term      Preterm      AB      Living        SAB      IAB      Ectopic      Multiple      Live Births              History reviewed. No pertinent past medical history.  Past Surgical History:  Procedure Laterality Date  . TOE SURGERY  2009   rod placed in R third toe    History reviewed. No pertinent family history.  Social History       Tobacco Use  . Smoking status: Never Smoker  . Smokeless tobacco: Never Used  Vaping Use  . Vaping Use: Every day  . Substances: CBD  . Devices: CBD  Substance Use Topics  . Alcohol use: Yes    Allergies: No Known Allergies  No medications prior to admission.    Review of Systems  Constitutional: Negative.  Negative for fatigue and fever.  HENT: Negative.   Respiratory: Negative.  Negative for shortness of breath.   Cardiovascular: Negative.  Negative for chest pain.  Gastrointestinal: Positive for abdominal pain. Negative for constipation,  diarrhea, nausea and vomiting.  Genitourinary: Negative.  Negative for dysuria, vaginal bleeding and vaginal discharge.  Neurological: Negative.  Negative for dizziness and headaches.   Physical Exam   Blood pressure (!) 201/136, pulse (!) 124, temperature 98.4 F (36.9 C), temperature source Oral, resp. rate (!) 21, SpO2 97 %.   Patient Vitals for the past 24 hrs:  BP Temp Temp src Pulse Resp SpO2  02/16/21 1145 -- -- -- -- -- 98 %  02/16/21 1141 (!) 157/98 -- -- 85 -- --  02/16/21 1140 -- -- -- -- -- 98 %  02/16/21 1135 -- -- -- -- -- 99 %  02/16/21 1131 (!) 143/98 -- -- 88 -- --  02/16/21 1130 -- -- -- -- -- 95 %  02/16/21 1129 (!) 149/92 -- -- 88 -- --  02/16/21 1116 (!) 146/55 -- -- (!) 127 -- --  02/16/21 1101 (!) 166/121 -- -- 91 -- --  02/16/21 1055 -- -- -- -- -- 98 %  02/16/21 1050 -- -- -- -- -- 98 %  02/16/21 1047 (!) 181/132 -- -- 97 -- --  02/16/21 1044 -- -- -- -- -- 97 %  02/16/21 1040 -- -- -- -- -- 98 %  02/16/21 1035 -- -- -- -- -- 98 %  02/16/21 1031 (!) 211/142 -- -- (!) 116 -- --  02/16/21 1030 -- -- -- -- -- 98 %  02/16/21 1025 -- -- -- -- -- 98 %  02/16/21 1020 -- -- -- -- -- 97 %  02/16/21 1015 -- -- -- -- -- 98 %  02/16/21 1010 (!) 201/136 98.4 F (36.9 C) Oral (!) 124 (!) 21 97 %  02/16/21 0948 (!) 190/135 98.4 F (36.9 C) Oral (!) 133 20 97 %  02/16/21 0924 -- -- -- (!) 109 (!) 32 99 %  02/16/21 0923 -- -- -- (!) 107 18 98 %  02/16/21 0922 -- -- -- (!) 110 15 99 %  02/16/21 0921 -- -- -- (!) 110 (!) 21 99 %  02/16/21 0920 -- -- -- (!) 112 20 99 %  02/16/21 0915 (!) 174/115 -- -- (!) 117 (!) 37 97 %  02/16/21 0900 (!) 210/145 -- -- (!) 121 (!) 27 98 %  02/16/21 0837 (!) 208/137 -- -- -- (!) 24 100 %  02/16/21 0832 (!) 219/144 -- -- -- -- 100 %  02/16/21 0830 (!) 217/155 -- -- -- -- 98 %  02/16/21 0825 (!) 254/167 98.8 F (37.1 C) Oral (!) 135 20 100 %  02/16/21 0820 -- -- -- (!) 125 -- --     Physical Exam Vitals and nursing note  reviewed.  Constitutional:      General: She is not in acute distress.    Appearance: She is well-developed.  HENT:     Head: Normocephalic.  Eyes:     Pupils: Pupils are equal, round, and reactive to light.  Cardiovascular:     Rate and Rhythm: Normal rate and regular rhythm.     Heart sounds: Normal heart sounds.  Pulmonary:     Effort: Pulmonary effort is normal. No respiratory distress.     Breath sounds: Normal breath sounds.  Abdominal:     General: Bowel sounds are normal. There is no distension.     Palpations: Abdomen is soft.     Tenderness: There is no abdominal tenderness.     Comments: FH palpated at umbilicus  Skin:    General: Skin is warm and dry.  Neurological:     Mental Status: She is alert and oriented to person, place, and time.     Cranial Nerves: No cranial nerve deficit.     Motor: No weakness.     Deep Tendon Reflexes: Reflexes abnormal.     Reflex Scores:      Patellar reflexes are 3+ on the right side and 3+ on the left side.    Comments: 2 beat clonus bilaterally  Psychiatric:        Behavior: Behavior normal.        Thought Content: Thought content normal.        Judgment: Judgment normal.     MAU Course  Procedures Lab Results Last 24 Hours        Results for orders placed or performed during the hospital encounter of 02/16/21 (from the past 24 hour(s))  POC urine preg, ED     Status: Abnormal   Collection Time: 02/16/21  8:36 AM  Result Value Ref Range   Preg Test, Ur POSITIVE (A) NEGATIVE  Urinalysis, Routine w reflex microscopic Urine, Clean Catch  Status: Abnormal   Collection Time: 02/16/21  8:44 AM  Result Value Ref Range   Color, Urine YELLOW YELLOW   APPearance HAZY (A) CLEAR   Specific Gravity, Urine 1.011 1.005 - 1.030   pH 7.0 5.0 - 8.0   Glucose, UA NEGATIVE NEGATIVE mg/dL   Hgb urine dipstick MODERATE (A) NEGATIVE   Bilirubin Urine NEGATIVE NEGATIVE   Ketones, ur NEGATIVE NEGATIVE mg/dL   Protein, ur  >=161>=300 (A) NEGATIVE mg/dL   Nitrite NEGATIVE NEGATIVE   Leukocytes,Ua TRACE (A) NEGATIVE   RBC / HPF 0-5 0 - 5 RBC/hpf   WBC, UA 21-50 0 - 5 WBC/hpf   Bacteria, UA RARE (A) NONE SEEN   Squamous Epithelial / LPF 6-10 0 - 5  hCG, quantitative, pregnancy     Status: Abnormal   Collection Time: 02/16/21  8:54 AM  Result Value Ref Range   hCG, Beta Chain, Quant, S 68,450 (H) <5 mIU/mL  Comprehensive metabolic panel     Status: Abnormal   Collection Time: 02/16/21  8:57 AM  Result Value Ref Range   Sodium 136 135 - 145 mmol/L   Potassium 3.3 (L) 3.5 - 5.1 mmol/L   Chloride 102 98 - 111 mmol/L   CO2 25 22 - 32 mmol/L   Glucose, Bld 84 70 - 99 mg/dL   BUN 6 6 - 20 mg/dL   Creatinine, Ser 0.960.88 0.44 - 1.00 mg/dL   Calcium 8.2 (L) 8.9 - 10.3 mg/dL   Total Protein 5.2 (L) 6.5 - 8.1 g/dL   Albumin 2.2 (L) 3.5 - 5.0 g/dL   AST 73 (H) 15 - 41 U/L   ALT 79 (H) 0 - 44 U/L   Alkaline Phosphatase 127 (H) 38 - 126 U/L   Total Bilirubin 0.6 0.3 - 1.2 mg/dL   GFR, Estimated >04>60 >54>60 mL/min   Anion gap 9 5 - 15  CBC with Differential     Status: Abnormal   Collection Time: 02/16/21  8:57 AM  Result Value Ref Range   WBC 13.4 (H) 4.0 - 10.5 K/uL   RBC 4.38 3.87 - 5.11 MIL/uL   Hemoglobin 12.9 12.0 - 15.0 g/dL   HCT 09.837.7 11.936.0 - 14.746.0 %   MCV 86.1 80.0 - 100.0 fL   MCH 29.5 26.0 - 34.0 pg   MCHC 34.2 30.0 - 36.0 g/dL   RDW 82.915.3 56.211.5 - 13.015.5 %   Platelets 124 (L) 150 - 400 K/uL   nRBC 0.0 0.0 - 0.2 %   Neutrophils Relative % 78 %   Neutro Abs 10.4 (H) 1.7 - 7.7 K/uL   Lymphocytes Relative 15 %   Lymphs Abs 2.0 0.7 - 4.0 K/uL   Monocytes Relative 6 %   Monocytes Absolute 0.8 0.1 - 1.0 K/uL   Eosinophils Relative 1 %   Eosinophils Absolute 0.1 0.0 - 0.5 K/uL   Basophils Relative 0 %   Basophils Absolute 0.1 0.0 - 0.1 K/uL   Immature Granulocytes 0 %   Abs Immature Granulocytes 0.06 0.00 - 0.07 K/uL  ABO/Rh     Status: None   Collection Time:  02/16/21  9:36 AM  Result Value Ref Range   ABO/RH(D)      O POS Performed at [facility], [address] [place], [place] [address]      MDM Upon arrival, patient had unknown gestational age and severe blood pressures.  PO labetalol given assuming patient was early pregnant. After review of labs done in ED,  patient was likely further along. Limited bedside ultrasound performed and approximately 19 week fetus seen without cardiac activity. Ultrasonographer called to bedside for formal confirmation.   UA, UPT CBC, CMP, HCG, Protein/creat ratio ABO/Rh [place] MFM OB Complete Preeclampsia orderset DIC panel  Labetalol 20mg , 40mg , 80mg  given with improvement of BP Labs likely HELLP syndrome- will start Magnesium IV  After formal ultrasound, fetus measures approximately 19 weeks with absent cardiac activity, no IUD visualized.   Results reviewed at length with patient. Patient and support person verbalized understanding and plan of care for IOL. Pain management options reviewed at length. At this time, patient does not want to see fetus at delivery and does not desire autopsy or genetic testing.   Dr. notified of patient arrival, complaint and results- will admit to labor and delivery for IOL with cytotec.  Assessment and Plan  -19 week IUFD -HELLP Syndrome  -Admit to labor and delivery -Report called to V. Macey Wurtz CNM  CNM 02/16/2021, 10:13 AM          Per consult w/ Dr. Vergie Living Cytotec 600 mcg then 400 Q4.  TORCH Titers, OB Panel, DIC Panel Pt declines autopsy No longer candidate for epidural due to Plt dripping 124 to 106 in 2.5 hours.  HELLP labs Q6   Rolm Bookbinder, 04/18/2021, Vergie Living 02/16/2021 1:13 PM

## 2021-02-16 NOTE — ED Triage Notes (Signed)
C/o lower ABD cramping x 10 days. Pt starting having urinary symptoms 7 days ago with urinary frequency and foul smell. Pt having some bleeding, however unsure of LMP due to birth control. Pt had positive pregancy test 2 days ago; this would be first pregnancy.

## 2021-02-16 NOTE — MAU Note (Addendum)
Patient reports to mau from [facility] with c/o lower abd cramping that has been ongoing for the past 10 days.  Pt states she bled yesterday but it was stopped today.  States bleeding was more than spotting but less than period. Pt unsure of LMP, due to copper IUD. States she hasnt had a period since nov 2021.  BP 190/135

## 2021-02-16 NOTE — MAU Provider Note (Signed)
History     CSN: [id number]  Arrival date and time: 02/16/21 0932   Event Date/Time   First Provider Initiated Contact with Patient 02/16/21 1013      Chief Complaint  Patient presents with  . Abdominal Pain   HPI Sarah Banks is a 25 y.o. G1P0 at Unknown gestation who presents from [facility] for evaluation of her pregnancy. She [facility] and has not had a period since then. She reports she started having intermittent abdominal pain this week. She rates the pain a 4/10 and tylenol has not helped. She also reports intermittent nausea. She denies bleeding or leaking of fluid.   She reports white coat syndrome when she presented to the ER and denies hx of hypertension. She reports a "black sunspot" in her vision that started about a week ago. She denies HA or right upper quadrant pain. Labs were drawn in the ER before her arrival to MAU.  OB History    Gravida  1   Para      Term      Preterm      AB      Living        SAB      IAB      Ectopic      Multiple      Live Births              History reviewed. No pertinent past medical history.  Past Surgical History:  Procedure Laterality Date  . TOE SURGERY  2009   rod placed in R third toe    History reviewed. No pertinent family history.  Social History   Tobacco Use  . Smoking status: Never Smoker  . Smokeless tobacco: Never Used  Vaping Use  . Vaping Use: Every day  . Substances: CBD  . Devices: CBD  Substance Use Topics  . Alcohol use: Yes    Allergies: No Known Allergies  No medications prior to admission.    Review of Systems  Constitutional: Negative.  Negative for fatigue and fever.  HENT: Negative.   Respiratory: Negative.  Negative for shortness of breath.   Cardiovascular: Negative.  Negative for chest pain.  Gastrointestinal: Positive for abdominal pain. Negative for constipation, diarrhea, nausea and vomiting.  Genitourinary:  Negative.  Negative for dysuria, vaginal bleeding and vaginal discharge.  Neurological: Negative.  Negative for dizziness and headaches.   Physical Exam   Blood pressure (!) 201/136, pulse (!) 124, temperature 98.4 F (36.9 C), temperature source Oral, resp. rate (!) 21, SpO2 97 %.   Patient Vitals for the past 24 hrs:  BP Temp Temp src Pulse Resp SpO2  02/16/21 1145 -- -- -- -- -- 98 %  02/16/21 1141 (!) 157/98 -- -- 85 -- --  02/16/21 1140 -- -- -- -- -- 98 %  02/16/21 1135 -- -- -- -- -- 99 %  02/16/21 1131 (!) 143/98 -- -- 88 -- --  02/16/21 1130 -- -- -- -- -- 95 %  02/16/21 1129 (!) 149/92 -- -- 88 -- --  02/16/21 1116 (!) 146/55 -- -- (!) 127 -- --  02/16/21 1101 (!) 166/121 -- -- 91 -- --  02/16/21 1055 -- -- -- -- -- 98 %  02/16/21 1050 -- -- -- -- -- 98 %  02/16/21 1047 (!) 181/132 -- -- 97 -- --  02/16/21 1044 -- -- -- -- -- 97 %  02/16/21 1040 -- -- -- -- --  98 %  02/16/21 1035 -- -- -- -- -- 98 %  02/16/21 1031 (!) 211/142 -- -- (!) 116 -- --  02/16/21 1030 -- -- -- -- -- 98 %  02/16/21 1025 -- -- -- -- -- 98 %  02/16/21 1020 -- -- -- -- -- 97 %  02/16/21 1015 -- -- -- -- -- 98 %  02/16/21 1010 (!) 201/136 98.4 F (36.9 C) Oral (!) 124 (!) 21 97 %  02/16/21 0948 (!) 190/135 98.4 F (36.9 C) Oral (!) 133 20 97 %  02/16/21 0924 -- -- -- (!) 109 (!) 32 99 %  02/16/21 0923 -- -- -- (!) 107 18 98 %  02/16/21 0922 -- -- -- (!) 110 15 99 %  02/16/21 0921 -- -- -- (!) 110 (!) 21 99 %  02/16/21 0920 -- -- -- (!) 112 20 99 %  02/16/21 0915 (!) 174/115 -- -- (!) 117 (!) 37 97 %  02/16/21 0900 (!) 210/145 -- -- (!) 121 (!) 27 98 %  02/16/21 0837 (!) 208/137 -- -- -- (!) 24 100 %  02/16/21 0832 (!) 219/144 -- -- -- -- 100 %  02/16/21 0830 (!) 217/155 -- -- -- -- 98 %  02/16/21 0825 (!) 254/167 98.8 F (37.1 C) Oral (!) 135 20 100 %  02/16/21 0820 -- -- -- (!) 125 -- --     Physical Exam Vitals and nursing note reviewed.  Constitutional:      General: She is not in  acute distress.    Appearance: She is well-developed.  HENT:     Head: Normocephalic.  Eyes:     Pupils: Pupils are equal, round, and reactive to light.  Cardiovascular:     Rate and Rhythm: Normal rate and regular rhythm.     Heart sounds: Normal heart sounds.  Pulmonary:     Effort: Pulmonary effort is normal. No respiratory distress.     Breath sounds: Normal breath sounds.  Abdominal:     General: Bowel sounds are normal. There is no distension.     Palpations: Abdomen is soft.     Tenderness: There is no abdominal tenderness.     Comments: FH palpated at umbilicus  Skin:    General: Skin is warm and dry.  Neurological:     Mental Status: She is alert and oriented to person, place, and time.     Cranial Nerves: No cranial nerve deficit.     Motor: No weakness.     Deep Tendon Reflexes: Reflexes abnormal.     Reflex Scores:      Patellar reflexes are 3+ on the right side and 3+ on the left side.    Comments: 2 beat clonus bilaterally  Psychiatric:        Behavior: Behavior normal.        Thought Content: Thought content normal.        Judgment: Judgment normal.     MAU Course  Procedures Results for orders placed or performed during the hospital encounter of 02/16/21 (from the past 24 hour(s))  POC urine preg, ED     Status: Abnormal   Collection Time: 02/16/21  8:36 AM  Result Value Ref Range   Preg Test, Ur POSITIVE (A) NEGATIVE  Urinalysis, Routine w reflex microscopic Urine, Clean Catch     Status: Abnormal   Collection Time: 02/16/21  8:44 AM  Result Value Ref Range   Color, Urine YELLOW YELLOW   APPearance HAZY (A) CLEAR  Specific Gravity, Urine 1.011 1.005 - 1.030   pH 7.0 5.0 - 8.0   Glucose, UA NEGATIVE NEGATIVE mg/dL   Hgb urine dipstick MODERATE (A) NEGATIVE   Bilirubin Urine NEGATIVE NEGATIVE   Ketones, ur NEGATIVE NEGATIVE mg/dL   Protein, ur >=086 (A) NEGATIVE mg/dL   Nitrite NEGATIVE NEGATIVE   Leukocytes,Ua TRACE (A) NEGATIVE   RBC / HPF 0-5  0 - 5 RBC/hpf   WBC, UA 21-50 0 - 5 WBC/hpf   Bacteria, UA RARE (A) NONE SEEN   Squamous Epithelial / LPF 6-10 0 - 5  hCG, quantitative, pregnancy     Status: Abnormal   Collection Time: 02/16/21  8:54 AM  Result Value Ref Range   hCG, Beta Chain, Quant, S 68,450 (H) <5 mIU/mL  Comprehensive metabolic panel     Status: Abnormal   Collection Time: 02/16/21  8:57 AM  Result Value Ref Range   Sodium 136 135 - 145 mmol/L   Potassium 3.3 (L) 3.5 - 5.1 mmol/L   Chloride 102 98 - 111 mmol/L   CO2 25 22 - 32 mmol/L   Glucose, Bld 84 70 - 99 mg/dL   BUN 6 6 - 20 mg/dL   Creatinine, Ser 5.78 0.44 - 1.00 mg/dL   Calcium 8.2 (L) 8.9 - 10.3 mg/dL   Total Protein 5.2 (L) 6.5 - 8.1 g/dL   Albumin 2.2 (L) 3.5 - 5.0 g/dL   AST 73 (H) 15 - 41 U/L   ALT 79 (H) 0 - 44 U/L   Alkaline Phosphatase 127 (H) 38 - 126 U/L   Total Bilirubin 0.6 0.3 - 1.2 mg/dL   GFR, Estimated >46 >96 mL/min   Anion gap 9 5 - 15  CBC with Differential     Status: Abnormal   Collection Time: 02/16/21  8:57 AM  Result Value Ref Range   WBC 13.4 (H) 4.0 - 10.5 K/uL   RBC 4.38 3.87 - 5.11 MIL/uL   Hemoglobin 12.9 12.0 - 15.0 g/dL   HCT 29.5 28.4 - 13.2 %   MCV 86.1 80.0 - 100.0 fL   MCH 29.5 26.0 - 34.0 pg   MCHC 34.2 30.0 - 36.0 g/dL   RDW 44.0 10.2 - 72.5 %   Platelets 124 (L) 150 - 400 K/uL   nRBC 0.0 0.0 - 0.2 %   Neutrophils Relative % 78 %   Neutro Abs 10.4 (H) 1.7 - 7.7 K/uL   Lymphocytes Relative 15 %   Lymphs Abs 2.0 0.7 - 4.0 K/uL   Monocytes Relative 6 %   Monocytes Absolute 0.8 0.1 - 1.0 K/uL   Eosinophils Relative 1 %   Eosinophils Absolute 0.1 0.0 - 0.5 K/uL   Basophils Relative 0 %   Basophils Absolute 0.1 0.0 - 0.1 K/uL   Immature Granulocytes 0 %   Abs Immature Granulocytes 0.06 0.00 - 0.07 K/uL  ABO/Rh     Status: None   Collection Time: 02/16/21  9:36 AM  Result Value Ref Range   ABO/RH(D)      O POS Performed at [facility], [address] [place], [place] [address]     MDM Upon arrival, patient had unknown gestational age and severe blood pressures.  PO labetalol given assuming patient was early pregnant. After review of labs done in ED, patient was likely further along. Limited bedside ultrasound performed and approximately 19 week fetus seen without cardiac activity. Ultrasonographer called to bedside for formal confirmation.   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   Results reviewed at length with patient. Patient and support person verbalized understanding and plan of care for IOL. Pain management options reviewed at length. At this time, patient does not want to see fetus at delivery and does not desire autopsy or genetic testing.   Dr. Vergie LivingPickens notified of patient arrival, complaint and results- will admit to labor and delivery for IOL with cytotec.  Assessment and Plan  -19 week IUFD -HELLP Syndrome  -Admit to labor and delivery -Report called to V. Smith CNM  Rolm BookbinderCaroline M Vola Beneke CNM 02/16/2021, 10:13 AM

## 2021-02-17 ENCOUNTER — Encounter (HOSPITAL_COMMUNITY): Payer: Self-pay | Admitting: Obstetrics and Gynecology

## 2021-02-17 DIAGNOSIS — O328XX0 Maternal care for other malpresentation of fetus, not applicable or unspecified: Secondary | ICD-10-CM

## 2021-02-17 DIAGNOSIS — Z30432 Encounter for removal of intrauterine contraceptive device: Secondary | ICD-10-CM

## 2021-02-17 DIAGNOSIS — Z3A19 19 weeks gestation of pregnancy: Secondary | ICD-10-CM

## 2021-02-17 DIAGNOSIS — O1420 HELLP syndrome (HELLP), unspecified trimester: Secondary | ICD-10-CM

## 2021-02-17 DIAGNOSIS — Z3490 Encounter for supervision of normal pregnancy, unspecified, unspecified trimester: Secondary | ICD-10-CM

## 2021-02-17 DIAGNOSIS — O364XX0 Maternal care for intrauterine death, not applicable or unspecified: Secondary | ICD-10-CM

## 2021-02-17 DIAGNOSIS — O1424 HELLP syndrome, complicating childbirth: Secondary | ICD-10-CM

## 2021-02-17 LAB — COMPREHENSIVE METABOLIC PANEL
ALT: 62 U/L — ABNORMAL HIGH (ref 0–44)
ALT: 69 U/L — ABNORMAL HIGH (ref 0–44)
AST: 41 U/L (ref 15–41)
AST: 49 U/L — ABNORMAL HIGH (ref 15–41)
Albumin: 2 g/dL — ABNORMAL LOW (ref 3.5–5.0)
Albumin: 2 g/dL — ABNORMAL LOW (ref 3.5–5.0)
Alkaline Phosphatase: 114 U/L (ref 38–126)
Alkaline Phosphatase: 124 U/L (ref 38–126)
Anion gap: 7 (ref 5–15)
Anion gap: 8 (ref 5–15)
BUN: 8 mg/dL (ref 6–20)
BUN: 8 mg/dL (ref 6–20)
CO2: 23 mmol/L (ref 22–32)
CO2: 25 mmol/L (ref 22–32)
Calcium: 7.3 mg/dL — ABNORMAL LOW (ref 8.9–10.3)
Calcium: 7.8 mg/dL — ABNORMAL LOW (ref 8.9–10.3)
Chloride: 100 mmol/L (ref 98–111)
Chloride: 99 mmol/L (ref 98–111)
Creatinine, Ser: 0.88 mg/dL (ref 0.44–1.00)
Creatinine, Ser: 0.95 mg/dL (ref 0.44–1.00)
GFR, Estimated: >60 mL/min (ref >60)
GFR, Estimated: >60 mL/min (ref >60)
Glucose, Bld: 93 mg/dL (ref 70–99)
Glucose, Bld: 99 mg/dL (ref 70–99)
Potassium: 3.7 mmol/L (ref 3.5–5.1)
Potassium: 3.9 mmol/L (ref 3.5–5.1)
Sodium: 131 mmol/L — ABNORMAL LOW (ref 135–145)
Sodium: 131 mmol/L — ABNORMAL LOW (ref 135–145)
Total Bilirubin: 0.4 mg/dL (ref 0.3–1.2)
Total Bilirubin: 0.4 mg/dL (ref 0.3–1.2)
Total Protein: 4.9 g/dL — ABNORMAL LOW (ref 6.5–8.1)
Total Protein: 5.2 g/dL — ABNORMAL LOW (ref 6.5–8.1)

## 2021-02-17 LAB — CBC
HCT: 34.1 % — ABNORMAL LOW (ref 36.0–46.0)
HCT: 35.1 % — ABNORMAL LOW (ref 36.0–46.0)
Hemoglobin: 11.8 g/dL — ABNORMAL LOW (ref 12.0–15.0)
Hemoglobin: 12.3 g/dL (ref 12.0–15.0)
MCH: 29.3 pg (ref 26.0–34.0)
MCH: 30 pg (ref 26.0–34.0)
MCHC: 34.6 g/dL (ref 30.0–36.0)
MCHC: 35 g/dL (ref 30.0–36.0)
MCV: 84.6 fL (ref 80.0–100.0)
MCV: 85.6 fL (ref 80.0–100.0)
Platelets: 141 10*3/uL — ABNORMAL LOW (ref 150–400)
Platelets: 156 10*3/uL (ref 150–400)
RBC: 4.03 MIL/uL (ref 3.87–5.11)
RBC: 4.1 MIL/uL (ref 3.87–5.11)
RDW: 15.1 % (ref 11.5–15.5)
RDW: 15.2 % (ref 11.5–15.5)
WBC: 17.1 10*3/uL — ABNORMAL HIGH (ref 4.0–10.5)
WBC: 18.1 10*3/uL — ABNORMAL HIGH (ref 4.0–10.5)
nRBC: 0 % (ref 0.0–0.2)
nRBC: 0 % (ref 0.0–0.2)

## 2021-02-17 LAB — GC/CHLAMYDIA PROBE AMP (~~LOC~~) NOT AT ARMC
Chlamydia: NEGATIVE
Comment: NEGATIVE
Comment: NORMAL
Neisseria Gonorrhea: NEGATIVE

## 2021-02-17 LAB — RUBELLA SCREEN: Rubella: 0.98 index — ABNORMAL LOW (ref >0.99)

## 2021-02-17 LAB — HSV 1 ANTIBODY, IGG: HSV 1 Glycoprotein G Ab, IgG: <0.91 index (ref 0.00–0.90)

## 2021-02-17 LAB — HEPATITIS C ANTIBODY: HCV Ab: NONREACTIVE

## 2021-02-17 LAB — HEPATITIS B SURFACE ANTIGEN: Hepatitis B Surface Ag: NONREACTIVE

## 2021-02-17 LAB — CMV ANTIBODY, IGG (EIA): CMV Ab - IgG: <0.6 U/mL (ref 0.00–0.59)

## 2021-02-17 LAB — TOXOPLASMA GONDII ANTIBODY, IGG: Toxoplasma IgG Ratio: <3 IU/mL (ref 0.0–7.1)

## 2021-02-17 LAB — RPR: RPR Ser Ql: NONREACTIVE

## 2021-02-17 LAB — HSV 2 ANTIBODY, IGG: HSV 2 Glycoprotein G Ab, IgG: <0.91 index (ref 0.00–0.90)

## 2021-02-17 MED ORDER — ACETAMINOPHEN 325 MG PO TABS
650.0000 mg | ORAL_TABLET | Freq: Four times a day (QID) | ORAL | Status: DC
  Administered 2021-02-17 – 2021-02-21 (×14): 650 mg via ORAL
  Filled 2021-02-17 (×14): qty 2

## 2021-02-17 MED ORDER — ONDANSETRON HCL 4 MG PO TABS
4.0000 mg | ORAL_TABLET | ORAL | Status: DC | PRN
Start: 2021-02-17 — End: 2021-02-21

## 2021-02-17 MED ORDER — SIMETHICONE 80 MG PO CHEW: 80.0000 mg | CHEWABLE_TABLET | ORAL | Status: DC | PRN

## 2021-02-17 MED ORDER — SENNOSIDES-DOCUSATE SODIUM 8.6-50 MG PO TABS
2.0000 | ORAL_TABLET | Freq: Every day | ORAL | Status: DC
  Administered 2021-02-18 – 2021-02-21 (×4): 2 via ORAL
  Filled 2021-02-17 (×4): qty 2

## 2021-02-17 MED ORDER — BENZOCAINE-MENTHOL 20-0.5 % EX AERO: 1.0000 "application " | INHALATION_SPRAY | CUTANEOUS | Status: DC | PRN

## 2021-02-17 MED ORDER — LACTATED RINGERS IV SOLN: INTRAVENOUS | Status: DC

## 2021-02-17 MED ORDER — OXYTOCIN BOLUS FROM INFUSION
333.0000 mL | Freq: Once | INTRAVENOUS | Status: AC
  Administered 2021-02-17: 333 mL via INTRAVENOUS

## 2021-02-17 MED ORDER — IBUPROFEN 600 MG PO TABS
600.0000 mg | ORAL_TABLET | Freq: Four times a day (QID) | ORAL | Status: DC
  Administered 2021-02-17 – 2021-02-19 (×9): 600 mg via ORAL
  Filled 2021-02-17 (×9): qty 1

## 2021-02-17 MED ORDER — LACTATED RINGERS IV SOLN: INTRAVENOUS | Status: AC

## 2021-02-17 MED ORDER — DIPHENHYDRAMINE HCL 25 MG PO CAPS: 25.0000 mg | ORAL_CAPSULE | Freq: Four times a day (QID) | ORAL | Status: DC | PRN

## 2021-02-17 MED ORDER — LACTATED RINGERS IV SOLN: 500.0000 mL | INTRAVENOUS | Status: DC | PRN

## 2021-02-17 MED ORDER — DIBUCAINE (PERIANAL) 1 % EX OINT: 1.0000 "application " | TOPICAL_OINTMENT | CUTANEOUS | Status: DC | PRN

## 2021-02-17 MED ORDER — TETANUS-DIPHTH-ACELL PERTUSSIS 5-2.5-18.5 LF-MCG/0.5 IM SUSY: 0.5000 mL | PREFILLED_SYRINGE | Freq: Once | INTRAMUSCULAR | Status: DC

## 2021-02-17 MED ORDER — WITCH HAZEL-GLYCERIN EX PADS: 1.0000 "application " | MEDICATED_PAD | CUTANEOUS | Status: DC | PRN

## 2021-02-17 MED ORDER — ONDANSETRON HCL 4 MG/2ML IJ SOLN: 4.0000 mg | INTRAMUSCULAR | Status: DC | PRN

## 2021-02-17 MED ORDER — MAGNESIUM SULFATE 40 GM/1000ML IV SOLN
2.0000 g/h | INTRAVENOUS | Status: AC
  Administered 2021-02-17: 2 g/h via INTRAVENOUS
  Filled 2021-02-17: qty 1000

## 2021-02-17 MED ORDER — FENTANYL CITRATE (PF) 100 MCG/2ML IJ SOLN
100.0000 ug | INTRAMUSCULAR | Status: DC | PRN
  Administered 2021-02-17: 100 ug via INTRAVENOUS
  Filled 2021-02-17 (×2): qty 2

## 2021-02-17 MED ORDER — COCONUT OIL OIL: 1.0000 "application " | TOPICAL_OIL | Status: DC | PRN

## 2021-02-17 MED ORDER — OXYTOCIN-SODIUM CHLORIDE 30-0.9 UT/500ML-% IV SOLN: 2.5000 [IU]/h | INTRAVENOUS | Status: DC

## 2021-02-17 MED ORDER — PRENATAL MULTIVITAMIN CH
1.0000 | ORAL_TABLET | Freq: Every day | ORAL | Status: DC
  Administered 2021-02-17 – 2021-02-20 (×4): 1 via ORAL
  Filled 2021-02-17 (×4): qty 1

## 2021-02-17 MED ORDER — OXYTOCIN-SODIUM CHLORIDE 30-0.9 UT/500ML-% IV SOLN
INTRAVENOUS | Status: AC
  Filled 2021-02-17: qty 500

## 2021-02-17 NOTE — Discharge Summary (Addendum)
Postpartum Discharge Summary     Patient Name: Sarah Banks DOB: 1996-03-08 MRN: [id number]  Date of admission: 02/16/2021 Delivery date:02/17/2021  Delivering provider: Sheila Oats  Date of discharge: 02/21/2021  Admitting diagnosis: Pregnancy [Z34.90] Intrauterine pregnancy: [redacted]w[redacted]d     Secondary diagnosis:  Principal Problem:   Vaginal delivery Active Problems:   Fetal demise before 20 weeks with retention of dead fetus   Pregnancy   HELLP (hemolytic anemia/elev liver enzymes/low platelets in pregnancy)   Encounter for IUD removal  Additional problems: as noted above  Discharge diagnosis: Vaginal delivery, Intrauterine fetal demise                      Post partum procedures: 24 hours of postpartum magnesium. Augmentation: Cytotec Complications: None  Hospital course: Induction of Labor With Vaginal Delivery   25 y.o. yo G1P1 at [redacted]w[redacted]d was admitted to the hospital 02/16/2021 for induction of labor.  Indication for induction: Intrauterine fetal demise in the setting of HELLP syndrome. On arrival, pt with severe range blood pressures with systolic blood pressure greater than 200. She received multiple doses of IV antihypertensives and was started on magnesium. Patient had an uncomplicated labor course as follows: Membrane Rupture Time/Date: 2:56 AM ,02/17/2021   Delivery Method:Vaginal, Spontaneous  Episiotomy: None  Lacerations:  None  Details of delivery can be found in separate delivery note. Of note, Paraguard IUD was expelled with delivery of placenta. Patient was continued on magnesium for 24 hours s/p vaginal delivery. Her BP was very difficult to manage postpartum and she went through different regimens of anti hypertensives and still required IV doses. Nephrology was consulted, they changed her regimen but felt she was okay to be discharged givne no evidence of end organ involvement, and close outpatient follow up.  She was discharged on Norvasc, Labetalol and Chlorthalidone.    Patient is discharged home 02/21/21. Of note, patient's mental status was stable throughout admission.  Newborn Data: Birth date:02/17/2021  Birth time:3:40 AM  Gender:Unknown  Living status:Fetal Demise  Apgars: ,  Weight:   Magnesium Sulfate received: No BMZ received: No Rhophylac:N/A Physical exam  Blood pressure (!) 154/95, pulse (!) 113, temperature 97.9 F (36.6 C), temperature source Oral, resp. rate 20, height 5\' 4"  (1.626 m), weight 97.5 kg, last menstrual period 09/07/2020, SpO2 100 %.  General: alert, cooperative and no distress Lochia: appropriate Uterine Fundus: firm Incision: N/A DVT Evaluation: No evidence of DVT seen on physical exam. Negative Homan's sign. No cords or calf tenderness.  Labs: Lab Results  Component Value Date   WBC 14.0 (H) 02/19/2021   HGB 11.3 (L) 02/19/2021   HCT 33.8 (L) 02/19/2021   MCV 88.5 02/19/2021   PLT 245 02/19/2021   CMP Latest Ref Rng & Units 02/19/2021  Glucose 70 - 99 mg/dL 81  BUN 6 - 20 mg/dL 8  Creatinine 04/21/2021 - 0.17 mg/dL 7.93  Sodium 9.03 - 009 mmol/L 138  Potassium 3.5 - 5.1 mmol/L 3.7  Chloride 98 - 111 mmol/L 106  CO2 22 - 32 mmol/L 25  Calcium 8.9 - 10.3 mg/dL 233)  Total Protein 6.5 - 8.1 g/dL 5.1(L)  Total Bilirubin 0.3 - 1.2 mg/dL 0.0(T)  Alkaline Phos 38 - 126 U/L 123  AST 15 - 41 U/L 26  ALT 0 - 44 U/L 34   Edinburgh Score: Edinburgh Postnatal Depression Scale Screening Tool 02/17/2021  I have been able to laugh and see the funny side of things. 0  I have  looked forward with enjoyment to things. 0  I have blamed myself unnecessarily when things went wrong. 0  I have been anxious or worried for no good reason. 0  I have felt scared or panicky for no good reason. 0  Things have been getting on top of me. 0  I have been so unhappy that I have had difficulty sleeping. 0  I have felt sad or miserable. 0  I have been so unhappy that I have been crying. 0  The thought of harming myself has occurred to me. 0   Edinburgh Postnatal Depression Scale Total 0     After visit meds:  Allergies as of 02/21/2021   No Known Allergies     Medication List    TAKE these medications   amLODipine 10 MG tablet Commonly known as: NORVASC Take 1 tablet (10 mg total) by mouth daily. Start taking on: Feb 22, 2021   chlorthalidone 25 MG tablet Commonly known as: HYGROTON Take 0.5 tablets (12.5 mg total) by mouth daily. Start taking on: Feb 22, 2021   labetalol 200 MG tablet Commonly known as: NORMODYNE Take 1 tablet (200 mg total) by mouth 2 (two) times daily.        Discharge home in stable condition Infant Disposition:morgue Discharge instruction: per After Visit Summary and Postpartum booklet. Activity: Advance as tolerated. Pelvic rest for 6 weeks.  Diet: routine diet Future Appointments: Future Appointments  Date Time Provider Department Center  02/25/2021 10:00 AM [facility] NURSE [facility]  03/19/2021  9:15 AM Hermina Staggers, MD [facility]   Follow up Visit:  Follow-up Information    [place], [place] Follow up.   Why: We will can with an appointment for early next week.  Contact information: [address] [place] [address] [phone]              Message sent to [facility] clinic by Dr. Barb Merino.  Please schedule this patient for a In person postpartum visit in 4 weeks with the following provider: MD. Additional Postpartum F/U:Postpartum Depression checkup and BP check 1 week  High risk pregnancy complicated by: HELLP syndrome, IUFD (VD at [redacted]w[redacted]d), undesired pregnancy, IUD expelled with delivery of placenta Delivery mode:  Vaginal, Spontaneous  Anticipated Birth Control:  Unsure   02/21/2021 Jaynie Collins, MD

## 2021-02-18 DIAGNOSIS — O1425 HELLP syndrome, complicating the puerperium: Secondary | ICD-10-CM

## 2021-02-18 LAB — CULTURE, OB URINE: Special Requests: NORMAL

## 2021-02-18 MED ORDER — NIFEDIPINE ER OSMOTIC RELEASE 30 MG PO TB24
60.0000 mg | ORAL_TABLET | Freq: Two times a day (BID) | ORAL | Status: DC
  Administered 2021-02-18 – 2021-02-20 (×5): 60 mg via ORAL
  Filled 2021-02-18 (×5): qty 2

## 2021-02-18 MED ORDER — NIFEDIPINE ER OSMOTIC RELEASE 30 MG PO TB24: 60.0000 mg | ORAL_TABLET | Freq: Every day | ORAL | Status: DC

## 2021-02-18 MED ORDER — NIFEDIPINE ER OSMOTIC RELEASE 30 MG PO TB24
30.0000 mg | ORAL_TABLET | Freq: Once | ORAL | Status: AC
  Administered 2021-02-18: 30 mg via ORAL
  Filled 2021-02-18: qty 1

## 2021-02-18 NOTE — Progress Notes (Signed)
Chaplain offered visit to patient.  Father of baby bedside. She was wiping tears while trying to maintain composure.  She declined visit.  She said  "We are fine. Thank you, though" twice. Rev. Lynnell Chad Pager [phone]

## 2021-02-18 NOTE — Progress Notes (Signed)
    Faculty Practice OB/GYN Attending Note  Subjective:  Patient denies any headaches, visual symptoms, RUQ/epigastric pain or other concerning symptoms.  Requiring IV Labetalol to treat BP last night.  Moderate lochia, no pain.     Objective:  Blood pressure (!) 155/89, pulse 90, temperature 97.8 F (36.6 C), temperature source Oral, resp. rate 19, height 5\' 4"  (1.626 m), weight 97.5 kg, last menstrual period 09/07/2020, SpO2 99 %.  Patient Vitals for the past 24 hrs:  BP Temp Temp src Pulse Resp SpO2  02/18/21 0720 (!) 155/89 97.8 F (36.6 C) Oral 90 19 99 %  02/18/21 0557 (!) 152/95 -- -- 80 -- --  02/18/21 0532 (!) 175/102 -- -- 86 -- --  02/18/21 0516 (!) 178/101 97.9 F (36.6 C) Oral 88 18 99 %  02/18/21 0049 (!) 149/87 -- -- 80 -- --  02/18/21 0016 (!) 168/89 -- -- 88 -- --  02/18/21 0005 -- -- -- -- -- 98 %  02/18/21 0004 (!) 163/93 97.8 F (36.6 C) Oral 87 18 98 %  02/18/21 0003 (!) 155/110 -- -- 89 -- --  02/17/21 1957 (!) 152/98 97.7 F (36.5 C) Oral 92 18 100 %  02/17/21 1900 -- -- -- -- 18 --  02/17/21 1800 -- -- -- -- 18 --  02/17/21 1700 -- -- -- -- 18 --  02/17/21 1600 -- -- -- -- 18 --  02/17/21 1542 (!) 144/89 97.6 F (36.4 C) Oral 92 18 99 %  02/17/21 1500 -- -- -- -- 18 --  02/17/21 1400 -- -- -- -- 18 --  02/17/21 1300 -- -- -- -- 18 --  02/17/21 1200 -- -- -- -- 18 --  02/17/21 1102 (!) 154/97 97.8 F (36.6 C) Oral 82 18 99 %  02/17/21 1000 -- -- -- -- 18 --  02/17/21 0900 -- -- -- -- 18 --   Gen: NAD HENT: Normocephalic, atraumatic Lungs: Normal respiratory effort Heart: Regular rate noted Abdomen: NT, soft Cervix: Deferred Ext: 2+ DTRs, no edema, no cyanosis, negative Homan's sign  Assessment & Plan:  25 y.o. G2P1011 PPD#1 s/p SVD of IUFD at around 19 weeks in the setting of HELLP.   - Increased Procardia to 60 mg bid for BP control. Continue to watch closely - Patient is considering Nexplanon for contraception - No other concerns, patient  is doing well mentally. - Routine postpartum care.    22, MD, FACOG Obstetrician & Gynecologist, [facility] for [facility]

## 2021-02-19 LAB — CBC
HCT: 33.8 % — ABNORMAL LOW (ref 36.0–46.0)
Hemoglobin: 11.3 g/dL — ABNORMAL LOW (ref 12.0–15.0)
MCH: 29.6 pg (ref 26.0–34.0)
MCHC: 33.4 g/dL (ref 30.0–36.0)
MCV: 88.5 fL (ref 80.0–100.0)
Platelets: 245 10*3/uL (ref 150–400)
RBC: 3.82 MIL/uL — ABNORMAL LOW (ref 3.87–5.11)
RDW: 15.6 % — ABNORMAL HIGH (ref 11.5–15.5)
WBC: 14 10*3/uL — ABNORMAL HIGH (ref 4.0–10.5)
nRBC: 0 % (ref 0.0–0.2)

## 2021-02-19 LAB — SURGICAL PATHOLOGY

## 2021-02-19 LAB — COMPREHENSIVE METABOLIC PANEL
ALT: 34 U/L (ref 0–44)
AST: 26 U/L (ref 15–41)
Albumin: 2.1 g/dL — ABNORMAL LOW (ref 3.5–5.0)
Alkaline Phosphatase: 123 U/L (ref 38–126)
Anion gap: 7 (ref 5–15)
BUN: 8 mg/dL (ref 6–20)
CO2: 25 mmol/L (ref 22–32)
Calcium: 8.6 mg/dL — ABNORMAL LOW (ref 8.9–10.3)
Chloride: 106 mmol/L (ref 98–111)
Creatinine, Ser: 0.8 mg/dL (ref 0.44–1.00)
GFR, Estimated: >60 mL/min (ref >60)
Glucose, Bld: 81 mg/dL (ref 70–99)
Potassium: 3.7 mmol/L (ref 3.5–5.1)
Sodium: 138 mmol/L (ref 135–145)
Total Bilirubin: 0.2 mg/dL — ABNORMAL LOW (ref 0.3–1.2)
Total Protein: 5.1 g/dL — ABNORMAL LOW (ref 6.5–8.1)

## 2021-02-19 LAB — TORCH-IGM(TOXO/ RUB/ CMV/ HSV) W TITER
CMV IgM: <30 AU/mL (ref 0.0–29.9)
HSVI/II Comb IgM: <0.91 Ratio (ref 0.00–0.90)
Rubella IgM: <20 AU/mL (ref 0.0–19.9)
Toxoplasma Antibody- IgM: <3 AU/mL (ref 0.0–7.9)

## 2021-02-19 LAB — INFECT DISEASE AB IGM REFLEX 1

## 2021-02-19 MED ORDER — LABETALOL HCL 5 MG/ML IV SOLN
20.0000 mg | INTRAVENOUS | Status: DC | PRN
  Administered 2021-02-19 – 2021-02-20 (×3): 20 mg via INTRAVENOUS
  Filled 2021-02-19 (×3): qty 4

## 2021-02-19 MED ORDER — HYDRALAZINE HCL 50 MG PO TABS
50.0000 mg | ORAL_TABLET | Freq: Three times a day (TID) | ORAL | Status: DC
  Administered 2021-02-19 – 2021-02-20 (×4): 50 mg via ORAL
  Filled 2021-02-19 (×4): qty 1

## 2021-02-19 MED ORDER — HYDRALAZINE HCL 20 MG/ML IJ SOLN: 10.0000 mg | INTRAMUSCULAR | Status: DC | PRN

## 2021-02-19 MED ORDER — CHLORTHALIDONE 25 MG PO TABS
25.0000 mg | ORAL_TABLET | Freq: Every day | ORAL | Status: DC
  Administered 2021-02-19 – 2021-02-20 (×2): 25 mg via ORAL
  Filled 2021-02-19 (×2): qty 1

## 2021-02-19 MED ORDER — LABETALOL HCL 5 MG/ML IV SOLN: 80.0000 mg | INTRAVENOUS | Status: DC | PRN

## 2021-02-19 MED ORDER — HYDRALAZINE HCL 50 MG PO TABS
25.0000 mg | ORAL_TABLET | Freq: Three times a day (TID) | ORAL | Status: DC
  Administered 2021-02-19: 25 mg via ORAL
  Filled 2021-02-19: qty 1

## 2021-02-19 MED ORDER — HYDRALAZINE HCL 20 MG/ML IJ SOLN
10.0000 mg | INTRAMUSCULAR | Status: DC | PRN
  Administered 2021-02-19 (×2): 10 mg via INTRAVENOUS
  Filled 2021-02-19 (×2): qty 1

## 2021-02-19 MED ORDER — LABETALOL HCL 5 MG/ML IV SOLN
40.0000 mg | INTRAVENOUS | Status: DC | PRN
  Administered 2021-02-20 (×2): 40 mg via INTRAVENOUS
  Filled 2021-02-19 (×2): qty 8

## 2021-02-19 NOTE — Progress Notes (Signed)
    Faculty Practice OB/GYN Attending Note  Subjective:  Patient denies any headaches, visual symptoms, RUQ/epigastric pain or other concerning symptoms.  Still had severe BP overnight.  Moderate lochia, no pain. Patient really wants to go home.     Objective:  Blood pressure (!) 147/108, pulse (!) 104, temperature 98.9 F (37.2 C), temperature source Oral, resp. rate 18, height 5\' 4"  (1.626 m), weight 97.5 kg, last menstrual period 09/07/2020, SpO2 100 %.  Patient Vitals for the past 24 hrs:  BP Temp Temp src Pulse Resp SpO2  02/19/21 1044 (!) 147/108 -- -- (!) 104 -- --  02/19/21 1042 (!) 172/97 -- -- (!) 104 -- --  02/19/21 0745 (!) 150/78 98.9 F (37.2 C) Oral (!) 104 18 100 %  02/19/21 0622 (!) 155/98 98.2 F (36.8 C) Oral 97 18 99 %  02/19/21 0132 (!) 143/88 -- -- 87 18 --  02/19/21 0113 (!) 162/94 -- -- 96 -- --  02/19/21 0057 (!) 160/102 97.9 F (36.6 C) Oral (!) 102 18 99 %  02/19/21 0055 -- -- -- -- -- 99 %  02/18/21 1937 (!) 156/97 98.2 F (36.8 C) Oral (!) 115 18 98 %  02/18/21 1617 (!) 156/97 98.1 F (36.7 C) Oral (!) 102 -- 97 %  02/18/21 1227 (!) 158/94 98.7 F (37.1 C) Oral 96 18 99 %   Gen: NAD HENT: Normocephalic, atraumatic Lungs: Normal respiratory effort Heart: Regular rate noted Abdomen: NT, soft Cervix: Deferred Ext: 2+ DTRs, no edema, no cyanosis, negative Homan's sign  Assessment & Plan:  25 y.o. 22 PPD#2 s/p SVD of IUFD at around 19 weeks in the setting of HELLP.   - Added Hydralazine 25 mg po tid to Procardia to 60 mg bid for BP control. Continue to watch closely. If stable BP for rest of day, may consider discharge this evening. - Patient is considering Nexplanon for contraception, will get outpatient. - No other concerns, patient is doing well mentally. - Routine postpartum care.    [id number], MD, FACOG Obstetrician & Gynecologist, [facility] for [facility]

## 2021-02-19 NOTE — Progress Notes (Signed)
Will also recheck labs (CMP, CBC) now.  After discussion with Shonna Chock , MD ([facility] OB/Hospitalist), will also add Chlorthalidone 25 mg daily.   Will monitor response. If develops headaches or other neurological symptoms, consider imaging.   Jaynie Collins, MD

## 2021-02-19 NOTE — Progress Notes (Signed)
Dr Macon Large notified of elevated BP. Instructed to repeat BP one hour after Hydralazine 50mg  po dose.

## 2021-02-19 NOTE — Progress Notes (Signed)
RN instructed to take BP one hour after patient receives Chlorthalidone.

## 2021-02-19 NOTE — Progress Notes (Signed)
    Faculty Practice OB/GYN Attending Note  Patient Vitals for the past 24 hrs:  BP Temp Temp src Pulse Resp SpO2  02/19/21 1212 (!) 191/116 -- -- (!) 103 -- --  02/19/21 1130 (!) 162/105 98.6 F (37 C) Oral 99 18 99 %  02/19/21 1058 (!) 156/95 -- -- (!) 107 -- --  02/19/21 1044 (!) 147/108 -- -- (!) 104 -- --  02/19/21 1042 (!) 172/97 -- -- (!) 104 -- --  02/19/21 0745 (!) 150/78 98.9 F (37.2 C) Oral (!) 104 18 100 %  02/19/21 0622 (!) 155/98 98.2 F (36.8 C) Oral 97 18 99 %  02/19/21 0132 (!) 143/88 -- -- 87 18 --  02/19/21 0113 (!) 162/94 -- -- 96 -- --  02/19/21 0057 (!) 160/102 97.9 F (36.6 C) Oral (!) 102 18 99 %  02/19/21 0055 -- -- -- -- -- 99 %  02/18/21 1937 (!) 156/97 98.2 F (36.8 C) Oral (!) 115 18 98 %  02/18/21 1617 (!) 156/97 98.1 F (36.7 C) Oral (!) 102 -- 97 %  02/18/21 1227 (!) 158/94 98.7 F (37.1 C) Oral 96 18 99 %   Patient is asymptomatic. These elevated BP are occuring despite Procardia XL 60 mg and Hydralazine 25 mg this morning. Will give Hydralazine 10 mg IV now, increase Hydralazine dosage to 50 mg po tid. Continue close inpatient monitoring.    Will talk to my Medicine colleagues about further management.   Jaynie Collins, MD, FACOG Obstetrician & Gynecologist, [facility] for [employer], [facility]

## 2021-02-19 NOTE — Progress Notes (Signed)
RN notified of elevated BP that was taken at 1130 at 1154. BP rechecked at 1212.

## 2021-02-20 MED ORDER — LABETALOL HCL 200 MG PO TABS
200.0000 mg | ORAL_TABLET | Freq: Two times a day (BID) | ORAL | Status: DC
  Administered 2021-02-20 – 2021-02-21 (×2): 200 mg via ORAL
  Filled 2021-02-20 (×2): qty 1

## 2021-02-20 MED ORDER — FUROSEMIDE 40 MG PO TABS
20.0000 mg | ORAL_TABLET | Freq: Every day | ORAL | Status: DC
  Administered 2021-02-20: 20 mg via ORAL
  Filled 2021-02-20: qty 1

## 2021-02-20 MED ORDER — NIFEDIPINE ER OSMOTIC RELEASE 30 MG PO TB24: 90.0000 mg | ORAL_TABLET | Freq: Every day | ORAL | Status: DC

## 2021-02-20 MED ORDER — LOSARTAN POTASSIUM 25 MG PO TABS
25.0000 mg | ORAL_TABLET | Freq: Every day | ORAL | Status: DC
  Administered 2021-02-20: 25 mg via ORAL
  Filled 2021-02-20: qty 1

## 2021-02-20 NOTE — Progress Notes (Signed)
Faculty Practice OB/GYN Attending Note  Subjective:  Patient required multiple doses of IV Labetalol and Hydralazine for BP control yesterday, despite being on Procardia at max dose, Hydralazine and Chlorthalidone.  Stable labs.  She denies any headaches, visual symptoms, RUQ/epigastric pain or other concerning symptoms.  Still had severe BP overnight/early morning requiring IV Labetolol.  Moderate lochia, no pain. Patient really wants to go home.     Objective:  Blood pressure (!) 150/97, pulse (!) 101, temperature 98 F (36.7 C), temperature source Oral, resp. rate 18, height 5\' 4"  (1.626 m), weight 97.5 kg, last menstrual period 09/07/2020, SpO2 98 %.  Patient Vitals for the past 24 hrs:  BP Temp Temp src Pulse Resp SpO2  02/20/21 0753 (!) 150/97 98 F (36.7 C) Oral (!) 101 18 98 %  02/20/21 0500 (!) 152/85 97.9 F (36.6 C) -- 92 17 98 %  02/20/21 0107 (!) 143/83 -- -- 86 -- --  02/20/21 0051 (!) 160/91 -- -- 89 -- --  02/20/21 0030 (!) 165/101 -- -- (!) 102 16 --  02/20/21 0014 (!) 183/116 98.4 F (36.9 C) Oral (!) 104 16 99 %  02/19/21 2111 (!) 159/99 98.4 F (36.9 C) Oral (!) 108 18 99 %  02/19/21 1845 (!) 159/92 -- -- (!) 115 -- --  02/19/21 1830 (!) 172/102 -- -- (!) 123 -- --  02/19/21 1621 (!) 156/98 -- -- (!) 105 -- --  02/19/21 1547 (!) 178/104 -- -- (!) 127 -- --  02/19/21 1516 (!) 177/95 98.7 F (37.1 C) Oral (!) 129 18 99 %  02/19/21 1444 (!) 188/104 -- -- (!) 135 -- --  02/19/21 1317 (!) 197/115 -- -- (!) 125 -- --  02/19/21 1245 (!) 190/119 -- -- (!) 116 -- --  02/19/21 1212 (!) 191/116 -- -- (!) 103 -- --  02/19/21 1130 (!) 162/105 98.6 F (37 C) Oral 99 18 99 %  02/19/21 1058 (!) 156/95 -- -- (!) 107 -- --  02/19/21 1044 (!) 147/108 -- -- (!) 104 -- --  02/19/21 1042 (!) 172/97 -- -- (!) 104 -- --   Gen: NAD HENT: Normocephalic, atraumatic Lungs: Normal respiratory effort Heart: Regular rate noted Abdomen: NT, soft Cervix: Deferred Ext: 2+ DTRs, no  edema, no cyanosis, negative Homan's sign  Labs: CBC Latest Ref Rng & Units 02/19/2021 02/17/2021 02/17/2021  WBC 4.0 - 10.5 K/uL 14.0(H) 18.1(H) 17.1(H)  Hemoglobin 12.0 - 15.0 g/dL 11.3(L) 11.8(L) 12.3  Hematocrit 36.0 - 46.0 % 33.8(L) 34.1(L) 35.1(L)  Platelets 150 - 400 K/uL 245 156 141(L)   CMP Latest Ref Rng & Units 02/19/2021 02/17/2021 02/17/2021  Glucose 70 - 99 mg/dL 81 99 93  BUN 6 - 20 mg/dL 8 8 8   Creatinine 0.44 - 1.00 mg/dL 04/19/2021 6.64  Sodium 135 - 145 mmol/L 138 131(L) 131(L)  Potassium 3.5 - 5.1 mmol/L 3.7 3.7 3.9  Chloride 98 - 111 mmol/L 106 100 99  CO2 22 - 32 mmol/L 25 23 25   Calcium 8.9 - 10.3 mg/dL 4.03) 7.3(L) 7.8(L)  Total Protein 6.5 - 8.1 g/dL 5.1(L) 4.9(L) 5.2(L)  Total Bilirubin 0.3 - 1.2 mg/dL 4.74) 0.4 0.4  Alkaline Phos 38 - 126 U/L 123 114 124  AST 15 - 41 U/L 26 41 49(H)  ALT 0 - 44 U/L 34 62(H) 69(H)    Assessment & Plan:  25 y.o. 2.5(Z PPD#3 s/p SVD of IUFD at around 19 weeks in the setting of HELLP.   -  Added Losartan 25 mg po qd, in addition to Hydralazine 50 mg po bid,  Procardia XL 60 mg bid, Chlorthalidone 25 mg po qd for BP control. Nephrology also consulted about further evaluation and management. Discussed with Dr. Marisue Humble, he said that his colleague Dr. Hyman Hopes will be by later to consult with patient. He advised to hold off on adding further medications for now, may need additional 24 hours to see cumulative effect of the recently added medications.  Will follow up on Dr. Marland Mcalpine recommendations.  Will continue to watch BP closely. Patient notified of consult that will occur sometime this afternoon. - No other concerns, patient is doing well mentally. - Routine postpartum care.    Jaynie Collins, MD, FACOG Obstetrician & Gynecologist, [facility] [employer], [facility]

## 2021-02-20 NOTE — Progress Notes (Signed)
Dr. Despina Hidden notified of elevated BP ordered to restart labetalol protocol.

## 2021-02-20 NOTE — Consult Note (Addendum)
Referring Provider: No ref. provider found Primary Care Physician:  Pcp, No Primary Nephrologist:    Reason for Consultation: Management of hypertension.  Preeclampsia with HELLP.   HPI: This is a 25 year old lady who was 19 weeks 2 days pregnant.  G1, P1.  She was admitted to the hospital 02/16/2021 for induction of labor.  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  Her systolic blood pressures on arrival greater than 200.  There appeared to be abruption of the placenta with expulsion of ParaGard IUD.  There does not appear to be any known history of hypertension.  Blood pressure currently 154/98 pulse 100 temperature 98.2 O2 sats 99%  Sodium 138 potassium 3.7 chloride 106 CO2 25 BUN 8 creatinine 0.8 albumin 2.1 hemoglobin 11.3 platelets 245.  Hydrochlorothiazide 25 mg daily, hydralazine 50 mg every 8 hours, losartan 25 mg daily, nifedipine 60 mg twice daily  IV labetalol 20 - 40 mg as needed    History reviewed. No pertinent past medical history.  Past Surgical History:  Procedure Laterality Date  . TOE SURGERY  2009   rod placed in R third toe    Prior to Admission medications   Not on File    Current Facility-Administered Medications  Medication Dose Route Frequency Provider Last Rate Last Admin  . acetaminophen (TYLENOL) tablet 650 mg  650 mg Oral Q6H Sheila Oats, MD   650 mg at 02/20/21 0530  . benzocaine-Menthol (DERMOPLAST) 20-0.5 % topical spray 1 application  1 application Topical PRN Sheila Oats, MD      . chlorthalidone (HYGROTON) tablet 25 mg  25 mg Oral Daily Anyanwu, Ugonna A, MD   25 mg at 02/20/21 1032  . coconut oil  1 application Topical PRN Sheila Oats, MD      . witch hazel-glycerin (TUCKS) pad 1 application  1 application Topical PRN Goswick, Skipper Cliche, MD       And  . dibucaine (NUPERCAINAL) 1 % rectal ointment 1 application  1 application Rectal PRN Sheila Oats, MD      . diphenhydrAMINE (BENADRYL) capsule 25 mg  25  mg Oral Q6H PRN Sheila Oats, MD      . hydrALAZINE (APRESOLINE) injection 10 mg  10 mg Intravenous Q2H PRN Anyanwu, Jethro Bastos, MD   10 mg at 02/19/21 1258  . labetalol (NORMODYNE) injection 20 mg  20 mg Intravenous PRN Anyanwu, Ugonna A, MD   20 mg at 02/20/21 1301   And  . labetalol (NORMODYNE) injection 40 mg  40 mg Intravenous PRN Anyanwu, Ugonna A, MD   40 mg at 02/20/21 1319   And  . labetalol (NORMODYNE) injection 80 mg  80 mg Intravenous PRN Anyanwu, Ugonna A, MD       And  . hydrALAZINE (APRESOLINE) injection 10 mg  10 mg Intravenous PRN Anyanwu, Ugonna A, MD      . hydrALAZINE (APRESOLINE) tablet 50 mg  50 mg Oral Q8H Anyanwu, Ugonna A, MD   50 mg at 02/20/21 1449  . losartan (COZAAR) tablet 25 mg  25 mg Oral Daily Anyanwu, Ugonna A, MD   25 mg at 02/20/21 1031  . NIFEdipine (PROCARDIA-XL/NIFEDICAL-XL) 24 hr tablet 60 mg  60 mg Oral BID Anyanwu, Ugonna A, MD   60 mg at 02/20/21 1031  . ondansetron (ZOFRAN) tablet 4 mg  4 mg Oral Q4H PRN Sheila Oats, MD       Or  . ondansetron [facility]) injection 4  mg  4 mg Intravenous Q4H PRN Sheila Oats, MD      . prenatal multivitamin tablet 1 tablet  1 tablet Oral Q1200 Sheila Oats, MD   1 tablet at 02/20/21 1032  . senna-docusate (Senokot-S) tablet 2 tablet  2 tablet Oral Daily Sheila Oats, MD   2 tablet at 02/20/21 1032  . simethicone (MYLICON) chewable tablet 80 mg  80 mg Oral PRN Sheila Oats, MD      . Tdap (BOOSTRIX) injection 0.5 mL  0.5 mL Intramuscular Once Sheila Oats, MD        Allergies as of 02/16/2021  . (No Known Allergies)    History reviewed. No pertinent family history.  Social History   Socioeconomic History  . Marital status: Single    Spouse name: Not on file  . Number of children: Not on file  . Years of education: Not on file  . Highest education level: Not on file  Occupational History  . Not on file  Tobacco Use  . Smoking status: Never Smoker  . Smokeless tobacco: Never Used   Vaping Use  . Vaping Use: Every day  . Substances: CBD  . Devices: CBD  Substance and Sexual Activity  . Alcohol use: Yes  . Drug use: Not on file    Comment: occasionally  . Sexual activity: Yes  Other Topics Concern  . Not on file  Social History Narrative  . Not on file   Social Determinants of Health   Financial Resource Strain: Not on file  Food Insecurity: Not on file  Transportation Needs: Not on file  Physical Activity: Not on file  Stress: Not on file  Social Connections: Not on file  Intimate Partner Violence: Not on file    Review of Systems: Gen: Denies any fever, chills, sweats, anorexia, fatigue, weakness, malaise, weight loss, and sleep disorder HEENT: No visual complaints, No history of Retinopathy. Normal external appearance No Epistaxis or Sore throat. No sinusitis.   CV: Denies chest pain, angina, palpitations, syncope, orthopnea, PND, peripheral edema, and claudication. Resp: Denies dyspnea at rest, dyspnea with exercise, cough, sputum, wheezing, coughing up blood, and pleurisy. GI: Denies vomiting blood, jaundice, and fecal incontinence.   Denies dysphagia or odynophagia. GU : Denies urinary burning, blood in urine, urinary frequency, urinary hesitancy, nocturnal urination, and urinary incontinence.  No renal calculi. MS: Denies joint pain, limitation of movement, and swelling, stiffness, low back pain, extremity pain. Denies muscle weakness, cramps, atrophy.  No use of non steroidal antiinflammatory drugs. Derm: Denies rash, itching, dry skin, hives, moles, warts, or unhealing ulcers.  Psych: Denies depression, anxiety, memory loss, suicidal ideation, hallucinations, paranoia, and confusion. Heme: Denies bruising, bleeding, and enlarged lymph nodes. Neuro: No headache.  No diplopia. No dysarthria.  No dysphasia.  No history of CVA.  No Seizures. No paresthesias.  No weakness. Endocrine No DM.  No Thyroid disease.  No Adrenal disease.  Physical  Exam: Vital signs in last 24 hours: Temp:  [97.9 F (36.6 C)-98.4 F (36.9 C)] 98.2 F (36.8 C) (05/05 1611) Pulse Rate:  [86-115] 100 (05/05 1611) Resp:  [16-19] 19 (05/05 1611) BP: (135-183)/(83-118) 154/98 (05/05 1611) SpO2:  [98 %-99 %] 99 % (05/05 1611)   General: Comfortable nondistressed Head:  Normocephalic and atraumatic. Eyes:  Sclera clear, no icterus.   Conjunctiva pink. Ears:  Normal auditory acuity. Nose:  No deformity, discharge,  or lesions. Mouth:  No deformity or lesions, dentition normal. Neck:  Supple;  no masses or thyromegaly. JVP not elevated Lungs:  Clear throughout to auscultation.   No wheezes, crackles, or rhonchi. No acute distress. Heart:  Regular rate and rhythm; no murmurs, clicks, rubs,  or gallops. Abdomen:  Soft, nontender and nondistended. No masses, hepatosplenomegaly or hernias noted. Normal bowel sounds, without guarding, and without rebound.   Msk:  Symmetrical without gross deformities. Normal posture. Pulses:  No carotid, renal, femoral bruits. DP and PT symmetrical and equal Extremities:  Without clubbing or edema. Neurologic:  Alert and  oriented x4;  grossly normal neurologically. Skin:  Intact without significant lesions or rashes. Cervical Nodes:  No significant cervical adenopathy. Psych:  Alert and cooperative. Normal mood and affect.  Intake/Output from previous day: No intake/output data recorded. Intake/Output this shift: No intake/output data recorded.  Lab Results: Recent Labs    02/19/21 1235  WBC 14.0*  HGB 11.3*  HCT 33.8*  PLT 245   BMET Recent Labs    02/19/21 1235  NA 138  K 3.7  CL 106  CO2 25  GLUCOSE 81  BUN 8  CREATININE 0.80  CALCIUM 8.6*   LFT Recent Labs    02/19/21 1235  PROT 5.1*  ALBUMIN 2.1*  AST 26  ALT 34  ALKPHOS 123  BILITOT 0.2*   PT/INR No results for input(s): LABPROT, INR in the last 72 hours. Hepatitis Panel No results for input(s): HEPBSAG, HCVAB, HEPAIGM, HEPBIGM in  the last 72 hours.  Studies/Results: No results found.  Assessment/Plan:  Preeclampsia/HELLP  Syndrome [redacted] weeks pregnant with fetal demise.  Delivery 02/17/2021 blood pressure challenging to control at this point.  Seems to respond fairly well to labetalol.  We will make some changes to current regimen.  We will add labetalol oral 200 mg twice daily.  Discontinue hydralazine.  Decrease Procardia to 90 mg once daily.  Discontinue hydrochlorothiazide.  Add Lasix 20 mg daily.  It may be reasonable to maintain her in the hospital until blood pressure is better controlled.  New blood pressure regimen:  Lasix 20 mg daily Labetalol 200 mg twice daily Procardia 90 mg once daily.   LOS: 4 Garnetta Buddy @TODAY @6 :40 PM

## 2021-02-21 ENCOUNTER — Other Ambulatory Visit (HOSPITAL_COMMUNITY): Payer: Self-pay

## 2021-02-21 MED ORDER — CHLORTHALIDONE 25 MG PO TABS
12.5000 mg | ORAL_TABLET | Freq: Every day | ORAL | Status: DC
  Administered 2021-02-21: 12.5 mg via ORAL
  Filled 2021-02-21: qty 0.5

## 2021-02-21 MED ORDER — LABETALOL HCL 200 MG PO TABS
200.0000 mg | ORAL_TABLET | Freq: Two times a day (BID) | ORAL | 0 refills | Status: DC
  Filled 2021-02-21: qty 60, 30d supply, fill #0

## 2021-02-21 MED ORDER — CHLORTHALIDONE 25 MG PO TABS
12.5000 mg | ORAL_TABLET | Freq: Every day | ORAL | 1 refills | Status: DC
  Filled 2021-02-21: qty 30, 60d supply, fill #0

## 2021-02-21 MED ORDER — AMLODIPINE BESYLATE 10 MG PO TABS
10.0000 mg | ORAL_TABLET | Freq: Every day | ORAL | 0 refills | Status: DC
  Filled 2021-02-21: qty 30, 30d supply, fill #0

## 2021-02-21 MED ORDER — AMLODIPINE BESYLATE 10 MG PO TABS
10.0000 mg | ORAL_TABLET | Freq: Every day | ORAL | Status: DC
  Administered 2021-02-21: 10 mg via ORAL
  Filled 2021-02-21: qty 1

## 2021-02-21 NOTE — Discharge Instructions (Signed)
Postpartum Hypertension Postpartum hypertension is high blood pressure that is higher than normal after childbirth. It usually starts within 1 to 2 days after delivery, but it can happen at any time for up to 6 weeks after delivery. For some women, medical treatment is required to prevent serious complications, such as seizures or stroke. What are the causes? The cause of this condition is not well understood. In some cases, the cause may not be known. Certain conditions may increase your risk. These include:  Hypertension that existed before pregnancy (chronic hypertension).  Hypertension that comes as a result of pregnancy (gestational hypertension).  Hypertensive disorders during pregnancy or seizures in women who have high blood pressure during pregnancy. These conditions are called preeclampsia and eclampsia.  A condition in which the liver, platelets, and red blood cells are damaged during pregnancy (HELLP syndrome).  Obesity.  Diabetes. What are the signs or symptoms? As with all types of hypertension, postpartum hypertension may not have any symptoms. Depending on how high your blood pressure is, you may experience:  Headaches. These may be mild, moderate, or severe. They may also be steady, constant, or sudden in onset (thunderclap headache).  Vision changes, such as blurry vision, flashing lights, or seeing spots.  Nausea and vomiting.  Pain in the upper right side of your abdomen.  Shortness of breath.  Difficulty breathing while lying down.  A decrease in the amount of urine that you pass. How is this diagnosed? This condition may be diagnosed based on the results of a physical exam, blood pressure measurements, and blood and urine tests. You may also have other tests, such as a CT scan or an MRI, to check for other problems of postpartum hypertension. How is this treated? If blood pressure is high enough to require treatment, your options may include:  Medicines to  reduce blood pressure (antihypertensives). Tell your health care provider if you are breastfeeding or if you plan to breastfeed. There are many antihypertensive medicines that are safe to take while breastfeeding.  Treating medical conditions that are causing hypertension.  Treating the complications of hypertension, such as seizures, stroke, or kidney problems. Your health care provider will also continue to monitor your blood pressure closely until it is within a safe range for you. Follow these instructions at home: Learn your goal blood pressure Two numbers make up your blood pressure. The first number is called systolic pressure. The second is called diastolic pressure. An example of a blood pressure reading is "120 over 80" (or 120/80). For most people, goal blood pressure is:  First number: below 140.  Second number: below 90. Your blood pressure is above normal even if only the top or bottom number is above normal. Know what to do before you take your blood pressure 30 minutes before you check your blood pressure:  Do not drink caffeine.  Do not drink alcohol.  Avoid food and drink.  Do not smoke.  Do not exercise. 5 minutes before you check your blood pressure:  Use the bathroom and urinate so that you have an empty bladder.  Sit quietly in a dining room chair. Do not sit in a soft couch or an armchair. Do not talk. Know how to take your blood pressure To check your blood pressure, follow the instructions in the manual that came with your blood pressure monitor. If you have a digital blood pressure monitor, the instructions may be as follows: 1. Sit up straight. 2. Place your feet on the floor. Do   not cross your ankles or legs. 3. Rest your left arm at the level of your heart. You may rest it on a table, desk, or chair. 4. Pull up your shirt sleeve. 5. Wrap the blood pressure cuff around the upper part of your left arm. The cuff should be 1 inch (2.5 cm) above your  elbow. It is best to wrap the cuff around bare skin. 6. Fit the cuff snugly around your arm. You should be able to place only one finger between the cuff and your arm. 7. Put the cord inside the groove of your elbow. 8. Press the power button. 9. Sit quietly while the cuff fills with air and loses air. 10. Write down the numbers on the screen. These are your blood pressure readings. 11. Wait 1-2 minutes and then repeat steps 1-10.   Record your blood pressure readings Follow your health care provider's instructions on how to record your blood pressure readings. If you were asked to use this form, follow these instructions:  Get one reading in the morning (a.m.) before you take any medicines.  Get one reading in the evening (p.m.) before supper.  Take at least 2 readings with each blood pressure check. This makes sure the results are correct. Wait 1-2 minutes between measurements.  Write down the results in the spaces on this form. Date: _______________________  a.m. _____________________(1st reading) _____________________(2nd reading)  p.m. _____________________(1st reading) _____________________(2nd reading) Date: _______________________  a.m. _____________________(1st reading) _____________________(2nd reading)  p.m. _____________________(1st reading) _____________________(2nd reading) Date: _______________________  a.m. _____________________(1st reading) _____________________(2nd reading)  p.m. _____________________(1st reading) _____________________(2nd reading) Date: _______________________  a.m. _____________________(1st reading) _____________________(2nd reading)  p.m. _____________________(1st reading) _____________________(2nd reading) Date: _______________________  a.m. _____________________(1st reading) _____________________(2nd reading)  p.m. _____________________(1st reading) _____________________(2nd reading) General instructions  Take over-the-counter and  prescription medicines only as told by your health care provider.  Do not use any products that contain nicotine or tobacco. These products include cigarettes, chewing tobacco, and vaping devices, such as e-cigarettes. If you need help quitting, ask your health care provider.  Check your blood pressure as often as recommended by your health care provider.  Return to your normal activities as told by your health care provider. Ask your health care provider what activities are safe for you.  Keep all follow-up visits. This is important. Contact a health care provider if:  You have new symptoms, such as: ? A headache that does not get better. ? Dizziness. ? Visual changes. ? Nausea and vomiting. Get help right away if:  You develop difficulty breathing.  You have chest pain.  You faint.  You have any symptoms of a stroke. "BE FAST" is an easy way to remember the main warning signs of a stroke: ? B - Balance. Signs are dizziness, sudden trouble walking, or loss of balance. ? E - Eyes. Signs are trouble seeing or a sudden change in vision. ? F - Face. Signs are sudden weakness or numbness of the face, or the face or eyelid drooping on one side. ? A - Arms. Signs are weakness or numbness in an arm. This happens suddenly and usually on one side of the body. ? S - Speech. Signs are sudden trouble speaking, slurred speech, or trouble understanding what people say. ? T - Time. Time to call emergency services. Write down what time symptoms started.  You have other signs of a stroke, such as: ? A sudden, severe headache with no known cause. ? Nausea or vomiting. ? Seizure. These symptoms   may represent a serious problem that is an emergency. Do not wait to see if the symptoms will go away. Get medical help right away. Call your local emergency services (911 in the [place] Do not drive yourself to the hospital. Summary  Postpartum hypertension is high blood pressure that remains higher than  normal after childbirth.  For some women, medical treatment is required to prevent serious complications, such as seizures or stroke.  Follow your health care provider's instructions on how to record your blood pressure readings.  Keep all follow-up visits. This is important. This information is not intended to replace advice given to you by your health care provider. Make sure you discuss any questions you have with your health care provider. Document Revised: 07/02/2020 Document Reviewed: 07/02/2020 Elsevier Patient Education  2021 [employer]     Vaginal Delivery, Care After This sheet gives you information about how to care for yourself after your delivery. Your health care provider may also give you more specific instructions. If you have problems or questions, contact your health care provider. What can I expect after the procedure? After delivery, it is common to have:  Soreness in your abdomen, your vagina, and the area between the opening of your vagina and your anus (perineum).  Tiredness (fatigue).  Cramps.  Breast tenderness related to engorgement.  Some bleeding and discharge from your vagina. This may continue for about 6 weeks. The bleeding and discharge will start out red, then become pink, then yellow, and finally white.  Tenderness in your vagina or perineum if you had an episiotomy or a vaginal tear. This may last several weeks.  Emotions that change quickly. Common emotions include: ? Sadness. ? Anger. ? Denial. ? Guilt. ? Depression. Follow these instructions at home: Vaginal and perineal care  Keep your perineum clean and dry as told by your health care provider.  Wipe from front to back when you use the toilet.  If you have an episiotomy or a vaginal tear, check for signs of infection, such as: ? Increasing redness, swelling, or pain in your perineal area. ? Pus or bad-smelling discharge coming from your wound or vagina.  To relieve pain at  the wound area or pain caused by hemorrhoids, try taking a warm sitz bath 2-3 times a day.  Do not use tampons or douche until your health care provider says it is okay. Medicines  Take over-the-counter and prescription medicines only as told by your health care provider.  If you were prescribed an antibiotic medicine, take it as told by your health care provider. Do not stop taking the antibiotic even if you start to feel better. Eating and drinking  Drink enough fluids to keep your urine pale yellow.  Eat high-fiber foods every day. These foods may help prevent or relieve constipation. High-fiber foods include: ? Whole grain cereals and breads. ? Brown rice. ? Beans. ? Fresh fruits and vegetables.   Activity  If possible, have someone help you with your household activities for at least a few days after you leave the hospital.  Return to your normal activities as told by your health care provider. Ask your health care provider what activities are safe for you.  Rest as much as possible.  Talk with your health care provider about when you can engage in sexual activity. This may depend on your: ? Risk of infection. ? Rate of healing. ? Comfort and desire to engage in sexual activity. Emotional support  Consider seeking support for  your loss. Some forms of support that you might consider include your religious leader, friends, family, a Pharmacist, hospital, or a bereavement support group. General instructions  Wear a supportive and well-fitting bra.  If you pass a blood clot, save it and call your health care provider to discuss. Do not flush blood clots down the toilet before you get instructions from your health care provider.  Keep all of your scheduled postpartum visits. At these visits, your health care provider will check to make sure that you are healing, both physically and emotionally. Contact a health care provider if:  You feel sad or depressed.  You are having  trouble eating or sleeping.  You lose interest in activities you used to enjoy.  You pass a blood clot from your vagina.  You have pus or a bad-smelling discharge coming from your wound or vagina.  You have increasing redness, swelling, or pain in your perineal area.  You feel pain or burning when you urinate.  You urinate more often than normal.  You have a fever.  Your breasts become hard, red, or painful.  You are dizzy or light-headed.  You have a rash.  You feel nauseous or you vomit.  You have not had a menstrual period by the 12th week after delivery. Get help right away if:  You have persistent pain that is not relieved by comfort measures or medicines.  You have chest pain or trouble breathing.  You have blurred vision or you see spots.  You have a severe headache.  You faint.  You have sudden, severe leg pain.  You bleed from your vagina so much that you fill more than one sanitary pad in one hour. Bleeding should not be heavier than your heaviest period.  You have thoughts of hurting yourself. If you ever feel like you may hurt yourself or others, or have thoughts about taking your own life, get help right away. You can go to your nearest emergency department or call:  Your local emergency services (911 in the [place]  A suicide crisis helpline, such as the National Suicide Prevention Lifeline at (782) 430-5578. This is open 24 hours a day. Summary  Take over-the-counter and prescription medicines only as told by your health care provider.  Return to your normal activities as told by your health care provider. Ask your health care provider what activities are safe for you.  Consider getting support for your loss. Sources of support include religious leaders, friends, family, professional counselors, and bereavement support groups.  Keep all follow-up visits as told by your health care provider. This is important. This information is not intended to  replace advice given to you by your health care provider. Make sure you discuss any questions you have with your health care provider. Document Revised: 10/20/2017 Document Reviewed: 01/14/2017 Elsevier Patient Education  2021 [employer].

## 2021-02-21 NOTE — Progress Notes (Signed)
Admit: 02/16/2021 LOS: 5  69F postpartum after IUFD at [redacted]wk GA with HTN  Subjective:  . Denies HA, SOB, CP, Vision changes, Abd pain, Back pain . Med changes reviewed . No SOB . She doesn't know if had BP before pregnancy . Nl GFR, K  3.9 and HCO3 23  05/05 0701 - 05/06 0700 In: 480 [P.O.:480] Out: 100 [Urine:100]  Filed Weights   02/16/21 1230  Weight: 97.5 kg    Scheduled Meds: . acetaminophen  650 mg Oral Q6H  . amLODipine  10 mg Oral Daily  . chlorthalidone  12.5 mg Oral Daily  . labetalol  200 mg Oral BID  . prenatal multivitamin  1 tablet Oral Q1200  . senna-docusate  2 tablet Oral Daily  . Tdap  0.5 mL Intramuscular Once   Continuous Infusions: PRN Meds:.benzocaine-Menthol, coconut oil, witch hazel-glycerin **AND** dibucaine, diphenhydrAMINE, hydrALAZINE, labetalol **AND** labetalol **AND** labetalol **AND** hydrALAZINE **AND** [COMPLETED] Measure blood pressure, ondansetron **OR** ondansetron (ZOFRAN) IV, simethicone  Current Labs: reviewed    Physical Exam:  Blood pressure (!) 169/101, pulse (!) 108, temperature 98.2 F (36.8 C), temperature source Oral, resp. rate 15, height 5\' 4"  (1.626 m), weight 97.5 kg, last menstrual period 09/07/2020, SpO2 99 %. NAD RRR CTAB Nl LEE No rashes/lesions Nl mood/affect  A 1. HTN, acutely elevated, no e/o end organ damage 2. Postpartum after IUFD at [redacted]wk GA 3. Obesity  P . Simplify regimen: amlodipine 10mg  qDay, Chlorthalidone 12.5mg  qDay . Asked her to obtain home BP monitor and measure daily . Will arrange for her to return to [facility] Mon/Tues next week for labs and BP check . Call [facility] on call for any questions or concerns . OK For discharge today . No NSAIDs given effect on BP   09/09/2020 MD 02/21/2021, 11:24 AM  Recent Labs  Lab 02/17/21 0015 02/17/21 0600 02/19/21 1235  NA 131* 131* 138  K 3.9 3.7 3.7  CL 99 100 106  CO2 25 23 25   GLUCOSE 93 99 81  BUN 8 8 8   CREATININE 0.95 0.88 0.80  CALCIUM 7.8*  7.3* 8.6*   Recent Labs  Lab 02/16/21 0857 02/16/21 1129 02/17/21 0015 02/17/21 0600 02/19/21 1235  WBC 13.4*   < > 17.1* 18.1* 14.0*  NEUTROABS 10.4*  --   --   --   --   HGB 12.9   < > 12.3 11.8* 11.3*  HCT 37.7   < > 35.1* 34.1* 33.8*  MCV 86.1   < > 85.6 84.6 88.5  PLT 124*   < > 141* 156 245   < > = values in this interval not displayed.

## 2021-02-21 NOTE — Progress Notes (Signed)
Patient given discharge instructions: S/S Preeclampsia reviewed and brochure given. Postpartum and emotional care reviewed with patient and patient verbalizes understanding of when to call MD. Patient currently waiting for her discharge medications to be delivered from [facility] pharmacy.

## 2021-02-21 NOTE — Progress Notes (Signed)
PHARMACY CONSULT NOTE - INITIAL  Pharmacy Consult for antihypertensive recommendations  No Known Allergies  Patient Measurements: Height: 5\' 4"  (162.6 cm) Weight: 97.5 kg (215 lb) IBW/kg (Calculated) : 54.7  Vital Signs: Temp: 98.2 F (36.8 C) (05/06 0852) Temp Source: Oral (05/06 0852) BP: 155/97 (05/06 0852) Pulse Rate: 92 (05/06 0852)  Medications:  Magnesium sulfate infusion (5/1 @11 :30 >> 5/3 @ 01:30) Nifedipine 30mg  x1 (5/3) Nifedipine 60mg  BID (5/3>>5/5) Hydralazine 25mg  q8h (5/4), 50mg  q8h (5/4>>5/5) Chlorthalidone 25mg /d (5/4>>5/5) Losartan 25mg  x1 (5/5) Furosemide 20mg /d (5/5>>) Labetalol 200mg  BID (5/5>>)  Assessment: 25 y.o. female s/p SVD of IUFD ~[redacted] weeks GA after presenting in HELLP per MD note. Patient with severe range blood pressures in MAU (max BP 183/116). She continues to have elevated blood pressures ranging from 140s-160s/90s despite multi-agent antihypertensive regimen. She was not on medication prior to admission. Patient was on Procardia 60mg  BID, hydralazine 50mg  q8h and chlorthalidone 25mg /d. Losartan 25mg /d added 5/5 but patient has only received one dose. She is not currently breast-feeding.  Plan:  Given patient's elevated blood pressures at <[redacted] weeks gestational age and continued issues postpartum, it is likely patient has chronic hypertension. Spoke with nephrology (Dr. ) who would like to change regimen to amlodipine 10mg /d and chlorthalidone 12.5mg /d. Reasonable to continue labetalol 200mg  BID given continued response to IV labetalol. Would recommend adding back losartan 25mg /d if continued high BP; however, BP effects not likely to be seen immediately. Could also increase labetalol or chlorthalidone doses for more immediate BP response. Spoke with Dr. on [facility] team who is agreeable to this plan.  07-30-1980 Lennie Dunnigan 02/21/2021,10:26 AM

## 2021-02-25 ENCOUNTER — Ambulatory Visit (INDEPENDENT_AMBULATORY_CARE_PROVIDER_SITE_OTHER): Payer: Self-pay

## 2021-02-25 ENCOUNTER — Other Ambulatory Visit: Payer: Self-pay

## 2021-02-25 VITALS — BP 155/106 | HR 108 | Wt 252.2 lb

## 2021-02-25 DIAGNOSIS — O1420 HELLP syndrome (HELLP), unspecified trimester: Secondary | ICD-10-CM

## 2021-02-25 MED ORDER — LABETALOL HCL 200 MG PO TABS: 400.0000 mg | ORAL_TABLET | Freq: Two times a day (BID) | ORAL | 0 refills | Status: DC

## 2021-02-25 NOTE — Progress Notes (Signed)
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 Pt score of 0 on Edinburgh scale today. Pt is laughing and happy today. Pt states is coping well after delivery. Pt states having occasionally headache that resolves with Motrin or Tylenol. Pt also states cutting down on caffeine recently. Denies any swelling, blurry vision or seeing spots.  Pt states has been taking BP at home and was lowest this morning 140/90s.  BP: 155/106, which pt states has been at home mostly and was at discharge. Reviewed BP with Dr Jolayne Panther and advised to begin 400 mg Labetalol BID and have 1 week BP check. Pt agreeable to plan of care. Rx sent to pharmacy on file.   Pt has PP visit on 6/1 with Dr Alysia Penna and for Nexplanon insertion. Pt aware and verbalized understanding to date and time of appt.   Judeth Cornfield, RN  02/25/21

## 2021-02-25 NOTE — Progress Notes (Signed)
Patient was assessed and managed by nursing staff during this encounter. I have reviewed the chart and agree with the documentation and plan. I have also made any necessary editorial changes.  Catalina Antigua, MD 02/25/2021 11:47 AM

## 2021-03-05 ENCOUNTER — Other Ambulatory Visit: Payer: Self-pay

## 2021-03-05 ENCOUNTER — Ambulatory Visit (INDEPENDENT_AMBULATORY_CARE_PROVIDER_SITE_OTHER): Payer: Self-pay | Admitting: *Deleted

## 2021-03-05 VITALS — BP 133/89 | HR 93 | Ht 64.0 in | Wt 254.9 lb

## 2021-03-05 DIAGNOSIS — Z8759 Personal history of other complications of pregnancy, childbirth and the puerperium: Secondary | ICD-10-CM

## 2021-03-05 DIAGNOSIS — Z0130 Encounter for examination of blood pressure without abnormal findings: Secondary | ICD-10-CM

## 2021-03-05 NOTE — Progress Notes (Signed)
Here for bp check after HELLP / IUFD 02/17/21 and d/c 02/21/21. Had nurse visit 02/25/21 BP 155/106 . Labetolol increased.  Today taking all 3 bp meds, denies edema, headaches , or visual disturbances. BP 140/82 with repeat BP 133/ 89. States bp at home runs average 130/80 or less. Reviewed with Dr. Debroah Loop and advised to continue taking bp meds as ordered and keep 03/19/21 postpartum appointment as scheduled. Also advised if develops severe headache, sudden edema or visual disturbances to go to hospital for evaluation. phq9 and gad7 negative.  She voices understanding.  Nyzaiah Kai,RN

## 2021-03-19 ENCOUNTER — Ambulatory Visit: Payer: Self-pay | Admitting: Obstetrics and Gynecology

## 2021-04-07 ENCOUNTER — Ambulatory Visit: Payer: Self-pay | Admitting: Obstetrics and Gynecology

## 2021-04-18 ENCOUNTER — Ambulatory Visit (INDEPENDENT_AMBULATORY_CARE_PROVIDER_SITE_OTHER): Payer: BC Managed Care – PPO | Admitting: Obstetrics & Gynecology

## 2021-04-18 ENCOUNTER — Other Ambulatory Visit (HOSPITAL_COMMUNITY)
Admission: RE | Admit: 2021-04-18 | Discharge: 2021-04-18 | Disposition: A | Discharge status: Home / Self Care | Payer: BC Managed Care – PPO | Source: Ambulatory Visit | Attending: Obstetrics and Gynecology | Admitting: Obstetrics and Gynecology

## 2021-04-18 ENCOUNTER — Other Ambulatory Visit: Payer: Self-pay

## 2021-04-18 ENCOUNTER — Encounter: Payer: Self-pay | Admitting: Obstetrics & Gynecology

## 2021-04-18 DIAGNOSIS — Z30017 Encounter for initial prescription of implantable subdermal contraceptive: Secondary | ICD-10-CM | POA: Diagnosis not present

## 2021-04-18 DIAGNOSIS — Z124 Encounter for screening for malignant neoplasm of cervix: Secondary | ICD-10-CM | POA: Diagnosis present

## 2021-04-18 DIAGNOSIS — O1415 Severe pre-eclampsia, complicating the puerperium: Secondary | ICD-10-CM | POA: Diagnosis not present

## 2021-04-18 DIAGNOSIS — Z3202 Encounter for pregnancy test, result negative: Secondary | ICD-10-CM

## 2021-04-18 LAB — POCT PREGNANCY, URINE: Preg Test, Ur: NEGATIVE

## 2021-04-18 MED ORDER — LABETALOL HCL 200 MG PO TABS: 400.0000 mg | ORAL_TABLET | Freq: Two times a day (BID) | ORAL | 2 refills | Status: AC

## 2021-04-18 MED ORDER — CHLORTHALIDONE 25 MG PO TABS: 25.0000 mg | ORAL_TABLET | Freq: Every day | ORAL | 2 refills | Status: AC

## 2021-04-18 MED ORDER — ETONOGESTREL 68 MG ~~LOC~~ IMPL
68.0000 mg | DRUG_IMPLANT | Freq: Once | SUBCUTANEOUS | Status: AC
  Administered 2021-04-18: 68 mg via SUBCUTANEOUS

## 2021-04-18 MED ORDER — AMLODIPINE BESYLATE 10 MG PO TABS: 10.0000 mg | ORAL_TABLET | Freq: Every day | ORAL | 2 refills | Status: DC

## 2021-04-18 NOTE — Progress Notes (Signed)
Post Partum Visit Note  Sarah Banks is a 25 y.o. G1P1 female who presents for a postpartum visit. She is 8 weeks postpartum following a induced vaginal delivery for 19 week IUFD in the setting of HELLP syndrome.  I have fully reviewed the prenatal and intrapartum course.  Postpartum course was complicated by difficult to control BP, was discharged on Norvasc, Labetalol and Chlorthalidone. Followed by Nephrology, has upcoming appointment next week.  Needs refills of her medications.  Bleeding moderate lochia. Bowel function is normal. Bladder function is normal. Patient is sexually active. Contraception method is none. Postpartum depression screening: negative.   The pregnancy intention screening data noted above was reviewed. Potential methods of contraception were discussed. The patient elected to proceed with Hormonal Implant.    Edinburgh Postnatal Depression Scale - 04/18/21 0831       Edinburgh Postnatal Depression Scale:  In the Past 7 Days   I have been able to laugh and see the funny side of things. 0    I have looked forward with enjoyment to things. 0    I have blamed myself unnecessarily when things went wrong. 0    I have felt scared or panicky for no good reason. 0    Things have been getting on top of me. 0    I have been so unhappy that I have had difficulty sleeping. 0    I have felt sad or miserable. 0    I have been so unhappy that I have been crying. 0    The thought of harming myself has occurred to me. 0             Health Maintenance Due  Topic Date Due   HPV VACCINES (1 - 2-dose series) Never done   PAP-Cervical Cytology Screening  Never done   PAP SMEAR-Modifier  Never done   COVID-19 Vaccine (3 - Booster for Pfizer series) 06/23/2020    The following portions of the patient's history were reviewed and updated as appropriate: allergies, current medications, past family history, past medical history, past social history, past surgical history, and  problem list.  Review of Systems Pertinent items noted in HPI and remainder of comprehensive ROS otherwise negative.  Objective:  BP (!) 165/112 (BP Location: Right Arm)   Pulse (!) 123   Ht 5\' 3"  (1.6 m)   Wt 256 lb (116.1 kg)   BMI 45.35 kg/m    General:  alert and no distress   Breasts:  not indicated  Lungs: clear to auscultation bilaterally  Heart:  regular rate and rhythm  Abdomen: soft, non-tender; bowel sounds normal; no masses,  no organomegaly    Nexplanon Insertion Procedure Patient identified, informed consent performed, consent signed.   Patient does understand that irregular bleeding is a very common side effect of this medication. She was advised to have backup contraception for one week after placement. Pregnancy test in clinic today was negative.  Appropriate time out taken.  Patient's left arm was prepped and draped in the usual sterile fashion. The ruler used to measure and mark insertion area.  Patient was prepped with alcohol swab and then injected with 3 ml of 1% lidocaine.  She was prepped with betadine, Nexplanon removed from packaging,  Device confirmed in needle, then inserted full length of needle and withdrawn per handbook instructions. Nexplanon was able to palpated in the patient's arm; patient palpated the insert herself. There was minimal blood loss.  Patient insertion site covered with guaze  and a pressure bandage to reduce any bruising.  The patient tolerated the procedure well and was given post procedure instructions.       Diagnoses and Orders:   1. Severe pre-eclampsia, with delivery, with current postpartum complication - amLODipine (NORVASC) 10 MG tablet; Take 1 tablet (10 mg total) by mouth daily.  Dispense: 30 tablet; Refill: 2 - chlorthalidone (HYGROTON) 25 MG tablet; Take 1 tablet (25 mg total) by mouth daily.  Dispense: 30 tablet; Refill: 2 - labetalol (NORMODYNE) 200 MG tablet; Take 2 tablets (400 mg total) by mouth 2 (two) times daily.   Dispense: 120 tablet; Refill: 2 - Ambulatory referral to Internal Medicine  2. Cervical cancer screening - Cytology - PAP( [place])  3. Insertion of Nexplanon - etonogestrel (NEXPLANON) implant 68 mg  4. Postpartum care following vaginal delivery  Postpartum Plan:   Essential components of care per ACOG recommendations:   1. HTN/Severe Preeclampsia:  - Medications refilled -Follow up with Nephrology and PCP, made referallto [facility]  2.  Mood and well being: Patient with negative depression screening today. Reviewed local resources for support.  - Patient tobacco use? No.   - hx of drug use? No.    3. Sexuality, contraception and birth spacing - Patient does not want a pregnancy in the next year.  Desired family size is unknown size. - Reviewed forms of contraception in tiered fashion. Patient had  Nexplanon  placed today.   - Discussed birth spacing of 18 months  4. Sleep and fatigue -Encouraged family/partner/community support of 4 hrs of uninterrupted sleep to help with mood and fatigue  5. Physical Recovery  - Discussed patients delivery. She describes her labor as mixed. - Patient had a Vaginal, no problems at delivery.  - Patient has urinary incontinence? No. - Patient is safe to resume physical and sexual activity  6.  Health Maintenance - HM due items addressed Yes - Pap smear done at today's visit.    Jaynie Collins, MD [facility] [employer], [facility]

## 2021-04-18 NOTE — Patient Instructions (Addendum)
Nexplanon Instructions After Insertion  Keep bandage clean and dry for 24 hours  May use ice/Tylenol/Ibuprofen for soreness or pain  If you develop fever, drainage or increased warmth from incision site-contact office immediately   Follow up with primary care physician to manage BP

## 2021-04-22 LAB — CYTOLOGY - PAP: Diagnosis: NEGATIVE

## 2024-04-24 DIAGNOSIS — I10 Essential (primary) hypertension: Secondary | ICD-10-CM | POA: Diagnosis not present

## 2024-05-01 ENCOUNTER — Encounter (HOSPITAL_BASED_OUTPATIENT_CLINIC_OR_DEPARTMENT_OTHER): Payer: Self-pay

## 2024-05-01 ENCOUNTER — Other Ambulatory Visit: Payer: Self-pay

## 2024-05-01 ENCOUNTER — Emergency Department (HOSPITAL_BASED_OUTPATIENT_CLINIC_OR_DEPARTMENT_OTHER): Admission: EM | Admit: 2024-05-01 | Discharge: 2024-05-01 | Disposition: A | Discharge status: Home / Self Care

## 2024-05-01 DIAGNOSIS — R030 Elevated blood-pressure reading, without diagnosis of hypertension: Secondary | ICD-10-CM | POA: Diagnosis not present

## 2024-05-01 DIAGNOSIS — I10 Essential (primary) hypertension: Secondary | ICD-10-CM | POA: Diagnosis not present

## 2024-05-01 DIAGNOSIS — Z79899 Other long term (current) drug therapy: Secondary | ICD-10-CM | POA: Diagnosis not present

## 2024-05-01 DIAGNOSIS — Z6841 Body Mass Index (BMI) 40.0 and over, adult: Secondary | ICD-10-CM | POA: Diagnosis not present

## 2024-05-01 HISTORY — DX: Essential (primary) hypertension: I10

## 2024-05-01 MED ORDER — AMLODIPINE BESYLATE 5 MG PO TABS
5.0000 mg | ORAL_TABLET | Freq: Once | ORAL | Status: AC
  Administered 2024-05-01: 5 mg via ORAL
  Filled 2024-05-01: qty 1

## 2024-05-01 MED ORDER — AMLODIPINE BESYLATE 5 MG PO TABS: 5.0000 mg | ORAL_TABLET | Freq: Every day | ORAL | 2 refills | Status: AC

## 2024-05-01 NOTE — ED Triage Notes (Signed)
 Pt presents via POV c/o hypertension. Denies chest pain, dizziness, or SOB. Ambulatory to triage. Reports possible hx of HTN however is not on any medications up to this point. Sent by PCP to be evaluated.

## 2024-05-01 NOTE — ED Provider Notes (Signed)
 [place] EMERGENCY DEPARTMENT AT [facility] Provider Note   CSN: [id number] Arrival date & time: 05/01/24  1845     Patient presents with: Hypertension   Sarah Banks is a 28 y.o. female.   28 year old female presents today for concern of elevated blood pressure.  Referred here from her PCP office.  Denies any chest pain, shortness of breath, balance issues, vision change, or headache.  States she went to her PCP office for medication refill but was instead sent here.  States she has not taken any medication since she last ran out which was quite sometime ago.  She took some medicine when she had an ectopic pregnancy.  At the time she took 3 different medicines.  Since then she has not taken any.  She states she has been keeping track of her blood pressure over the past week and a half and has been running high around 170s systolic.  She went into her PCP office and was referred here.  The history is provided by the patient. No language interpreter was used.       Prior to Admission medications   Medication Sig Start Date End Date Taking? Authorizing Provider  amLODipine  (NORVASC ) 5 MG tablet Take 1 tablet (5 mg total) by mouth daily. 05/01/24  Yes Gudrun Axe, PA-C  chlorthalidone  (HYGROTON ) 25 MG tablet Take 1 tablet (25 mg total) by mouth daily. 04/18/21   Anyanwu, Ugonna A, MD  labetalol  (NORMODYNE ) 200 MG tablet Take 2 tablets (400 mg total) by mouth 2 (two) times daily. 04/18/21   Anyanwu, Ugonna A, MD    Allergies: Patient has no known allergies.    Review of Systems  Constitutional:  Negative for chills and fever.  Eyes:  Negative for visual disturbance.  Respiratory:  Negative for shortness of breath.   Cardiovascular:  Negative for chest pain and palpitations.  Neurological:  Negative for dizziness, light-headedness and headaches.  All other systems reviewed and are negative.   Updated Vital Signs BP (!) 190/127 (BP Location: Right Arm)   Pulse 97   Temp 98.8  F (37.1 C) (Oral)   Resp 18   Ht 5' 4 (1.626 m)   Wt 104.3 kg   SpO2 100%   BMI 39.48 kg/m   Physical Exam Vitals and nursing note reviewed.  Constitutional:      General: She is not in acute distress.    Appearance: Normal appearance. She is not ill-appearing.  HENT:     Head: Normocephalic and atraumatic.     Nose: Nose normal.  Eyes:     Conjunctiva/sclera: Conjunctivae normal.  Cardiovascular:     Rate and Rhythm: Normal rate and regular rhythm.  Pulmonary:     Effort: Pulmonary effort is normal. No respiratory distress.  Musculoskeletal:        General: No deformity. Normal range of motion.     Cervical back: Normal range of motion.  Skin:    Findings: No rash.  Neurological:     Mental Status: She is alert.     (all labs ordered are listed, but only abnormal results are displayed) Labs Reviewed - No data to display  EKG: EKG Interpretation Date/Time:  Monday May 01 2024 20:33:52 EDT Ventricular Rate:  92 PR Interval:  187 QRS Duration:  94 QT Interval:  389 QTC Calculation: 482 R Axis:   46  Text Interpretation: Sinus rhythm Prominent P waves, nondiagnostic Repol abnrm suggests ischemia, lateral leads Confirmed by Neysa Clap [phone]) on 05/01/2024 8:48:33  PM  Radiology: No results found.   Procedures   Medications Ordered in the ED  amLODipine  (NORVASC ) tablet 5 mg (5 mg Oral Given 05/01/24 2028)                                    Medical Decision Making Risk Prescription drug management.   49-year-old female presents today for concern of elevated blood pressure.  No signs or symptoms indicative of endorgan damage.  EKG without acute ischemic change.  Amlodipine  dose given in the emergency department.  Prescribed as well.  Discussed keeping a blood pressure diary and following up with PCP.  Patient voices understanding and is in agreement with plan.  Final diagnoses:  Uncontrolled hypertension    ED Discharge Orders          Ordered     amLODipine  (NORVASC ) 5 MG tablet  Daily        05/01/24 2003               Hildegard Loge, PA-C 05/01/24 2051    Neysa Caron PARAS, DO 05/01/24 2322

## 2024-05-01 NOTE — Discharge Instructions (Signed)
 Amlodipine  sent into your pharmacy.  First dose given in the emergency department.  Follow-up with your primary care doctor in about a week.  In the meantime keep a blood pressure diary in the meantime.  Return for any chest pain, shortness of breath, balance issues, vision change

## 2024-05-09 DIAGNOSIS — E6609 Other obesity due to excess calories: Secondary | ICD-10-CM | POA: Diagnosis not present

## 2024-05-09 DIAGNOSIS — Z6841 Body Mass Index (BMI) 40.0 and over, adult: Secondary | ICD-10-CM | POA: Diagnosis not present

## 2024-05-09 DIAGNOSIS — I10 Essential (primary) hypertension: Secondary | ICD-10-CM | POA: Diagnosis not present

## 2024-05-12 DIAGNOSIS — I10 Essential (primary) hypertension: Secondary | ICD-10-CM | POA: Diagnosis not present

## 2024-05-12 DIAGNOSIS — E6609 Other obesity due to excess calories: Secondary | ICD-10-CM | POA: Diagnosis not present

## 2024-05-26 DIAGNOSIS — I10 Essential (primary) hypertension: Secondary | ICD-10-CM | POA: Diagnosis not present

## 2024-05-26 DIAGNOSIS — Z6841 Body Mass Index (BMI) 40.0 and over, adult: Secondary | ICD-10-CM | POA: Diagnosis not present

## 2024-05-26 DIAGNOSIS — E6609 Other obesity due to excess calories: Secondary | ICD-10-CM | POA: Diagnosis not present

## 2024-05-30 ENCOUNTER — Encounter (HOSPITAL_BASED_OUTPATIENT_CLINIC_OR_DEPARTMENT_OTHER): Payer: Self-pay | Admitting: Physician Assistant

## 2024-06-01 ENCOUNTER — Other Ambulatory Visit (HOSPITAL_COMMUNITY): Payer: Self-pay | Admitting: Physician Assistant

## 2024-06-01 DIAGNOSIS — I10 Essential (primary) hypertension: Secondary | ICD-10-CM

## 2024-06-02 ENCOUNTER — Ambulatory Visit (HOSPITAL_COMMUNITY)
Admission: RE | Admit: 2024-06-02 | Discharge: 2024-06-02 | Disposition: A | Discharge status: Home / Self Care | Source: Ambulatory Visit | Attending: Vascular Surgery | Admitting: Vascular Surgery

## 2024-06-02 DIAGNOSIS — I10 Essential (primary) hypertension: Secondary | ICD-10-CM | POA: Insufficient documentation

## 2024-07-13 ENCOUNTER — Encounter (HOSPITAL_BASED_OUTPATIENT_CLINIC_OR_DEPARTMENT_OTHER): Payer: Self-pay | Admitting: Physician Assistant

## 2024-08-25 DIAGNOSIS — I10 Essential (primary) hypertension: Secondary | ICD-10-CM | POA: Diagnosis not present

## 2024-08-25 DIAGNOSIS — Z6841 Body Mass Index (BMI) 40.0 and over, adult: Secondary | ICD-10-CM | POA: Diagnosis not present

## 2024-08-25 DIAGNOSIS — Z23 Encounter for immunization: Secondary | ICD-10-CM | POA: Diagnosis not present

## 2024-09-08 DIAGNOSIS — I10 Essential (primary) hypertension: Secondary | ICD-10-CM | POA: Diagnosis not present

## 2024-09-08 DIAGNOSIS — E6609 Other obesity due to excess calories: Secondary | ICD-10-CM | POA: Diagnosis not present

## 2024-09-08 DIAGNOSIS — Z6841 Body Mass Index (BMI) 40.0 and over, adult: Secondary | ICD-10-CM | POA: Diagnosis not present
